# Patient Record
Sex: Female | Born: 1980 | Race: White | Hispanic: No | Marital: Married | Smoking: Never smoker
Health system: Southern US, Community
[De-identification: ages and names within clinical notes are randomized; demographics above are authoritative.]

## PROBLEM LIST (undated history)

## (undated) DIAGNOSIS — T4145XA Adverse effect of unspecified anesthetic, initial encounter: Secondary | ICD-10-CM

## (undated) DIAGNOSIS — Z803 Family history of malignant neoplasm of breast: Secondary | ICD-10-CM

## (undated) DIAGNOSIS — F419 Anxiety disorder, unspecified: Secondary | ICD-10-CM

## (undated) DIAGNOSIS — Z8619 Personal history of other infectious and parasitic diseases: Secondary | ICD-10-CM

## (undated) DIAGNOSIS — J45909 Unspecified asthma, uncomplicated: Secondary | ICD-10-CM

## (undated) DIAGNOSIS — T8859XA Other complications of anesthesia, initial encounter: Secondary | ICD-10-CM

## (undated) DIAGNOSIS — N12 Tubulo-interstitial nephritis, not specified as acute or chronic: Secondary | ICD-10-CM

## (undated) HISTORY — PX: NO PAST SURGERIES: SHX2092

## (undated) HISTORY — DX: Family history of malignant neoplasm of breast: Z80.3

## (undated) HISTORY — DX: Unspecified asthma, uncomplicated: J45.909

## (undated) HISTORY — DX: Personal history of other infectious and parasitic diseases: Z86.19

---
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
## 2002-07-31 ENCOUNTER — Other Ambulatory Visit: Admission: RE | Admit: 2002-07-31 | Discharge: 2002-07-31 | Payer: Self-pay | Admitting: Obstetrics and Gynecology

## 2003-09-05 ENCOUNTER — Other Ambulatory Visit: Admission: RE | Admit: 2003-09-05 | Discharge: 2003-09-05 | Payer: Self-pay | Admitting: Obstetrics and Gynecology

## 2004-09-29 ENCOUNTER — Other Ambulatory Visit: Admission: RE | Admit: 2004-09-29 | Discharge: 2004-09-29 | Payer: Self-pay | Admitting: Obstetrics and Gynecology

## 2006-03-23 HISTORY — PX: EYE SURGERY: SHX253

## 2008-01-26 ENCOUNTER — Inpatient Hospital Stay (HOSPITAL_COMMUNITY): Admission: AD | Admit: 2008-01-26 | Discharge: 2008-01-26 | Payer: Self-pay | Admitting: Obstetrics and Gynecology

## 2008-01-27 ENCOUNTER — Inpatient Hospital Stay (HOSPITAL_COMMUNITY): Admission: AD | Admit: 2008-01-27 | Discharge: 2008-01-29 | Payer: Self-pay | Admitting: Obstetrics and Gynecology

## 2009-03-23 DIAGNOSIS — N12 Tubulo-interstitial nephritis, not specified as acute or chronic: Secondary | ICD-10-CM

## 2009-03-23 HISTORY — DX: Tubulo-interstitial nephritis, not specified as acute or chronic: N12

## 2010-11-03 ENCOUNTER — Inpatient Hospital Stay (HOSPITAL_COMMUNITY)
Admission: AD | Admit: 2010-11-03 | Discharge: 2010-11-03 | Disposition: A | Discharge status: Home / Self Care | Payer: Self-pay | Source: Ambulatory Visit | Attending: Obstetrics & Gynecology | Admitting: Obstetrics & Gynecology

## 2010-11-03 ENCOUNTER — Encounter (HOSPITAL_COMMUNITY): Payer: Self-pay

## 2010-11-03 DIAGNOSIS — O99891 Other specified diseases and conditions complicating pregnancy: Secondary | ICD-10-CM | POA: Insufficient documentation

## 2010-11-03 DIAGNOSIS — R109 Unspecified abdominal pain: Secondary | ICD-10-CM | POA: Insufficient documentation

## 2010-11-03 DIAGNOSIS — M549 Dorsalgia, unspecified: Secondary | ICD-10-CM

## 2010-11-03 DIAGNOSIS — O26899 Other specified pregnancy related conditions, unspecified trimester: Secondary | ICD-10-CM

## 2010-11-03 HISTORY — DX: Adverse effect of unspecified anesthetic, initial encounter: T41.45XA

## 2010-11-03 HISTORY — DX: Tubulo-interstitial nephritis, not specified as acute or chronic: N12

## 2010-11-03 HISTORY — DX: Other complications of anesthesia, initial encounter: T88.59XA

## 2010-11-03 LAB — WET PREP, GENITAL
Clue Cells Wet Prep HPF POC: NONE SEEN
Yeast Wet Prep HPF POC: NONE SEEN

## 2010-11-03 LAB — URINE MICROSCOPIC-ADD ON

## 2010-11-03 LAB — URINALYSIS, ROUTINE W REFLEX MICROSCOPIC
Glucose, UA: NEGATIVE mg/dL
Ketones, ur: NEGATIVE mg/dL
Protein, ur: NEGATIVE mg/dL
pH: 8 (ref 5.0–8.0)

## 2010-11-03 MED ORDER — ACETAMINOPHEN 500 MG PO TABS
1000.0000 mg | ORAL_TABLET | Freq: Once | ORAL | Status: AC
  Administered 2010-11-03: 1000 mg via ORAL
  Filled 2010-11-03: qty 2

## 2010-11-03 NOTE — ED Provider Notes (Signed)
History     Chief Complaint  Patient presents with  . Abdominal Cramping   HPI  OB History    Grav Para Term Preterm Abortions TAB SAB Ect Mult Living   2 1 1  0 0 0 0 0 0 1      Past Medical History  Diagnosis Date  . Complication of anesthesia     one side take of epidural  . Pyelonephritis 2011    not during pregnancy    Past Surgical History  Procedure Date  . No past surgeries     No family history on file.  History  Substance Use Topics  . Smoking status: Never Smoker   . Smokeless tobacco: Not on file  . Alcohol Use: No    Allergies: No Known Allergies  Prescriptions prior to admission  Medication Sig Dispense Refill  . Calcium Carbonate Antacid (TUMS ULTRA 1000 PO) Take 2 tablets by mouth 3 (three) times daily as needed. As needed for heartburn       . loratadine (CLARITIN) 10 MG tablet Take 10 mg by mouth daily as needed. As needed for allergies       . prenatal vitamin w/FE, FA (PRENATAL 1 + 1) 27-1 MG TABS Take 1 tablet by mouth daily.          ROS Physical Exam   Blood pressure 111/68, pulse 75, temperature 98 F (36.7 C), temperature source Oral, resp. rate 18, height 5\' 8"  (1.727 m), weight 68.584 kg (151 lb 3.2 oz), SpO2 100.00%.  Physical Exam  MAU Course  Procedures  MDM Preg at 27 weeks no contractions. Common discomfort of pregnancy.  Assessment and Plan  D/c home  Zerita Boers 11/03/2010, 8:50 PM In with c/o back

## 2010-11-03 NOTE — ED Provider Notes (Signed)
Chief Complaint:  Abdominal Cramping   Miranda Davis is  30 y.o. G2P1001 at [redacted]w[redacted]d presents complaining of Abdominal Cramping . Pt states having abdominal pain with awakening and some diarrhea.  She has had cramping in back and abdomen without vaginal bleeding, no rupture of membranes, +FM good, ?contractions every 30 minutes irregular. She denies any fever, chills, +nausea without emesis.  Denies any sick contacts directly.  Only had 2 episodes of diarrhea, without hematochezia.  Denies taking any medication for her symptoms.  Currently tolerating po.   She denies any urinary symptoms.  Receives prenatal care in [place].  Came to get checked out.  Obstetrical/Gynecological History: OB History    Grav Para Term Preterm Abortions TAB SAB Ect Mult Living   2 1 1  0 0 0 0 0 0 1     Denies any STIs or HSV Past Medical History: Past Medical History  Diagnosis Date  . Complication of anesthesia     one side take of epidural  . Pyelonephritis 2011    not during pregnancy    Past Surgical History: Past Surgical History  Procedure Date  . No past surgeries     Family History: No family history on file.  Social History: History  Substance Use Topics  . Smoking status: Never Smoker   . Smokeless tobacco: Not on file  . Alcohol Use: No    Allergies: No Known Allergies  Prescriptions prior to admission  Medication Sig Dispense Refill  . Calcium Carbonate Antacid (TUMS ULTRA 1000 PO) Take 2 tablets by mouth 3 (three) times daily as needed. As needed for heartburn       . loratadine (CLARITIN) 10 MG tablet Take 10 mg by mouth daily as needed. As needed for allergies       . prenatal vitamin w/FE, FA (PRENATAL 1 + 1) 27-1 MG TABS Take 1 tablet by mouth daily.          Review of Systems - Negative except per HPI  Physical Exam   Blood pressure 111/68, pulse 75, temperature 98 F (36.7 C), temperature source Oral, resp. rate 18, height 5\' 8"  (1.727 m), weight 68.584 kg (151  lb 3.2 oz), SpO2 100.00%.  General: General appearance - alert, well appearing, and in no distress Chest - clear to auscultation, no wheezes, rales or rhonchi, symmetric air entry Heart - normal rate, regular rhythm, normal S1, S2, no murmurs, rubs, clicks or gallops Abdomen - soft, nontender, nondistended, no masses or organomegaly Gravid, size cwd, no CVAT bilaterally, mild low back TTP Pelvic - normal external genitalia, vulva, vagina, cervix, uterus and adnexa, CERVIX: normal appearing cervix without discharge or lesions, visually closed Extremities - peripheral pulses normal, no pedal edema, no clubbing or cyanosis Baseline: 140 bpm, Variability: Good {> 6 bpm), Accelerations: Reactive and Decelerations: Absent none   Labs: No results found for this or any previous visit (from the past 24 hour(s)). Imaging Studies:  No results found.   Assessment: Miranda Davis is  30 y.o. G2P1001 at [redacted]w[redacted]d presents with possible viral colitis.  Plan: 1. Tylenol prn 2. Wet prep and cultures sent. Awaiting results.  Anticipate d/c home  Lashaunda Schild,LACHELLE8/13/20126:25 PM

## 2010-11-03 NOTE — Progress Notes (Signed)
Up to bathroom

## 2010-11-03 NOTE — Progress Notes (Signed)
Pt states she gets her prenatal care with Dr. Alyce Pagan in [facility] Has been having abdominal and back cramping. Has had some nausea, no vomiting but has had diarrhea x 2 today. No bleeding or leaking and good fetal movement.

## 2010-11-03 NOTE — Progress Notes (Signed)
Diarrhea with unknown cause, no particular food suspected, exposed on 7/28 to adults who had been around children with 5th disease, no relatives ill with diarhea

## 2010-12-23 LAB — CBC
HCT: 29.1 — ABNORMAL LOW
HCT: 33.2 — ABNORMAL LOW
Hemoglobin: 10.3 — ABNORMAL LOW
MCHC: 34.7
MCHC: 35.2
MCV: 100.4 — ABNORMAL HIGH
Platelets: 188
Platelets: 201
RBC: 3.3 — ABNORMAL LOW
RDW: 13.2
WBC: 10.7 — ABNORMAL HIGH

## 2010-12-23 LAB — RH IMMUNE GLOB WKUP(>/=20WKS)(NOT WOMEN'S HOSP)

## 2010-12-23 LAB — RPR: RPR Ser Ql: NONREACTIVE

## 2011-05-07 ENCOUNTER — Encounter (HOSPITAL_COMMUNITY): Payer: Self-pay | Admitting: *Deleted

## 2011-06-09 ENCOUNTER — Ambulatory Visit (INDEPENDENT_AMBULATORY_CARE_PROVIDER_SITE_OTHER): Payer: BC Managed Care – PPO | Admitting: Family Medicine

## 2011-06-09 ENCOUNTER — Ambulatory Visit: Payer: BC Managed Care – PPO

## 2011-06-09 VITALS — BP 114/77 | HR 63 | Temp 97.8°F | Resp 16 | Ht 68.0 in | Wt 130.8 lb

## 2011-06-09 DIAGNOSIS — N939 Abnormal uterine and vaginal bleeding, unspecified: Secondary | ICD-10-CM

## 2011-06-09 DIAGNOSIS — R109 Unspecified abdominal pain: Secondary | ICD-10-CM

## 2011-06-09 DIAGNOSIS — R1011 Right upper quadrant pain: Secondary | ICD-10-CM

## 2011-06-09 DIAGNOSIS — IMO0001 Reserved for inherently not codable concepts without codable children: Secondary | ICD-10-CM

## 2011-06-09 LAB — POCT URINALYSIS DIPSTICK
Bilirubin, UA: NEGATIVE
Glucose, UA: NEGATIVE
Ketones, UA: NEGATIVE
Leukocytes, UA: NEGATIVE
Nitrite, UA: NEGATIVE
Protein, UA: NEGATIVE
Spec Grav, UA: 1.02
Urobilinogen, UA: 0.2
pH, UA: 7.5

## 2011-06-09 LAB — POCT CBC
Granulocyte percent: 54.2 % (ref 37–80)
HCT, POC: 40.8 % (ref 37.7–47.9)
Hemoglobin: 13.6 g/dL (ref 12.2–16.2)
Lymph, poc: 2.8 (ref 0.6–3.4)
MCH, POC: 30.7 pg (ref 27–31.2)
MCHC: 33.3 g/dL (ref 31.8–35.4)
MCV: 92.1 fL (ref 80–97)
MID (cbc): 0.6 (ref 0–0.9)
MPV: 9.8 fL (ref 0–99.8)
POC Granulocyte: 4 (ref 2–6.9)
POC LYMPH PERCENT: 37.8 %L (ref 10–50)
POC MID %: 8 %M (ref 0–12)
Platelet Count, POC: 286 10*3/uL (ref 142–424)
RBC: 4.43 M/uL (ref 4.04–5.48)
RDW, POC: 11.9 %
WBC: 7.3 10*3/uL (ref 4.6–10.2)

## 2011-06-09 LAB — POCT URINE PREGNANCY: Preg Test, Ur: NEGATIVE

## 2011-06-09 LAB — POCT UA - MICROSCOPIC ONLY
Bacteria, U Microscopic: NEGATIVE
Casts, Ur, LPF, POC: NEGATIVE
Crystals, Ur, HPF, POC: NEGATIVE
Epithelial cells, urine per micros: NEGATIVE
Mucus, UA: NEGATIVE
RBC, urine, microscopic: NEGATIVE
WBC, Ur, HPF, POC: NEGATIVE
Yeast, UA: NEGATIVE

## 2011-06-09 MED ORDER — KETOROLAC TROMETHAMINE 30 MG/ML IJ SOLN
30.0000 mg | Freq: Once | INTRAMUSCULAR | Status: AC
  Administered 2011-06-09: 30 mg via INTRAMUSCULAR

## 2011-06-09 MED ORDER — NORGESTIM-ETH ESTRAD TRIPHASIC 0.18/0.215/0.25 MG-35 MCG PO TABS: 1.0000 | ORAL_TABLET | Freq: Every day | ORAL | Status: AC

## 2011-06-09 NOTE — Progress Notes (Signed)
[facility]:  Office Visit  Chief Complaint:  Chief Complaint  Patient presents with  . Abdominal Pain    rlq  . Vaginal Bleeding    irregular    HPI: Miranda Davis is a 31 y.o. female who complains of : 1. RUQ abd pain, sharp pain, constant, increases with movement and ROM, x 2-3 weeks. NO trauma/injury. Tried Advil with some relief. NO UTI sxs--last Sunday had nausea, fevers, chills, and was dx with flu and was given Tamiflu.  2. 4 months out from giving birth , G2P2L2. Started bleeding Feb 18. She is on birth control pill, and is on only progesterone. Not heavy cycles. Spotting mostly, She wears 2 light pads a day. + dizziness.    Past Medical History  Diagnosis Date  . Complication of anesthesia     one side take of epidural  . Pyelonephritis 2011    not during pregnancy   Past Surgical History  Procedure Date  . No past surgeries    History   Social History  . Marital Status: Married    Spouse Name: N/A    Number of Children: N/A  . Years of Education: N/A   Social History Main Topics  . Smoking status: Never Smoker   . Smokeless tobacco: None  . Alcohol Use: No  . Drug Use: No  . Sexually Active: Not Currently   Other Topics Concern  . None   Social History Narrative  . None   No family history on file. No Known Allergies Prior to Admission medications   Medication Sig Start Date End Date Taking? Authorizing Provider  Calcium Carbonate Antacid (TUMS ULTRA 1000 PO) Take 2 tablets by mouth 3 (three) times daily as needed. As needed for heartburn     Historical Provider, MD  loratadine (CLARITIN) 10 MG tablet Take 10 mg by mouth daily as needed. As needed for allergies     Historical Provider, MD  prenatal vitamin w/FE, FA (PRENATAL 1 + 1) 27-1 MG TABS Take 1 tablet by mouth daily.      Historical Provider, MD     ROS: The patient denies fevers, chills, night sweats, unintentional weight loss, chest pain, palpitations, wheezing,  dyspnea on exertion, nausea, vomiting, abdominal pain, dysuria, hematuria, melena, numbness, weakness, or tingling. + vaginal bleeding prolonged, + dizziness  All other systems have been reviewed and were otherwise negative with the exception of those mentioned in the HPI and as above.    PHYSICAL EXAM: Filed Vitals:   06/09/11 1328  BP: 114/77  Pulse: 63  Temp: 97.8 F (36.6 C)  Resp: 16   Filed Vitals:   06/09/11 1328  Height: 5\' 8"  (1.727 m)  Weight: 130 lb 12.8 oz (59.33 kg)   Body mass index is 19.89 kg/(m^2).  General: Alert, no acute distress HEENT:  Normocephalic, atraumatic, oropharynx patent.  Cardiovascular:  Regular rate and rhythm, no rubs murmurs or gallops.  No Carotid bruits, radial pulse intact. No pedal edema.  Respiratory: Clear to auscultation bilaterally.  No wheezes, rales, or rhonchi.  No cyanosis, no use of accessory musculature GI: No organomegaly, abdomen is soft, + RUQ tender, positive bowel sounds.  No masses. NO gurading or rebound Skin: No rashes. Neurologic: Facial musculature symmetric. Psychiatric: Patient is appropriate throughout our interaction. Lymphatic: No cervical lymphadenopathy Musculoskeletal: Gait intact. Cervix-normal, minimal blood.cervix closed. No tenderness, purulent draiange, masses/lesions  + Right CVA tenderness   LABS: Results for orders placed in visit on 06/09/11  POCT CBC      Component Value Range   WBC 7.3  4.6 - 10.2 (K/uL)   Lymph, poc 2.8  0.6 - 3.4    POC LYMPH PERCENT 37.8  10 - 50 (%L)   MID (cbc) 0.6  0 - 0.9    POC MID % 8.0  0 - 12 (%M)   POC Granulocyte 4.0  2 - 6.9    Granulocyte percent 54.2  37 - 80 (%G)   RBC 4.43  4.04 - 5.48 (M/uL)   Hemoglobin 13.6  12.2 - 16.2 (g/dL)   HCT, POC 16.1  09.6 - 47.9 (%)   MCV 92.1  80 - 97 (fL)   MCH, POC 30.7  27 - 31.2 (pg)   MCHC 33.3  31.8 - 35.4 (g/dL)   RDW, POC 04.5     Platelet Count, POC 286  142 - 424 (K/uL)   MPV 9.8  0 - 99.8 (fL)  POCT UA -  MICROSCOPIC ONLY      Component Value Range   WBC, Ur, HPF, POC neg     RBC, urine, microscopic neg     Bacteria, U Microscopic neg     Mucus, UA neg     Epithelial cells, urine per micros neg     Crystals, Ur, HPF, POC neg     Casts, Ur, LPF, POC neg     Yeast, UA neg     Amorphous mod    POCT URINALYSIS DIPSTICK      Component Value Range   Color, UA yellow     Clarity, UA clear     Glucose, UA neg     Bilirubin, UA neg     Ketones, UA neg     Spec Grav, UA 1.020     Blood, UA small     pH, UA 7.5     Protein, UA neg     Urobilinogen, UA 0.2     Nitrite, UA neg     Leukocytes, UA Negative    POCT URINE PREGNANCY      Component Value Range   Preg Test, Ur Negative       EKG/XRAY:   Primary read interpreted by Dr. Conley Rolls at [facility]. Xray negative except ive for ?  Opacity in upper right quadrant not related to ribs. ? Renal stone???  ASSESSMENT/PLAN: Encounter Diagnoses  Name Primary?  . Abdominal pain, acute, right upper quadrant Yes  . Flank pain   . Vaginal bleeding    ? Renal stone, defer CT scan for now. Will give toradol. Push fluids. Urine strain. Unlikely ectopic , pregnancy test  negative Change OCP to something that will decrease breakthrough bleeding with progesterone only pill, will go back to orthotricyclen.  Patient advised to return sooner or go to ER  if sxs persists with fever, chills, worsening RUQ pain.     Hamilton Capri PHUONG, DO 06/09/2011 2:59 PM

## 2011-06-10 LAB — COMPREHENSIVE METABOLIC PANEL
ALT: 19 U/L (ref 0–35)
AST: 21 U/L (ref 0–37)
Alkaline Phosphatase: 55 U/L (ref 39–117)
BUN: 11 mg/dL (ref 6–23)
Calcium: 9.2 mg/dL (ref 8.4–10.5)
Creat: 0.71 mg/dL (ref 0.50–1.10)
Total Bilirubin: 0.6 mg/dL (ref 0.3–1.2)

## 2011-06-10 LAB — COMPREHENSIVE METABOLIC PANEL WITH GFR
Albumin: 4.2 g/dL (ref 3.5–5.2)
CO2: 25 meq/L (ref 19–32)
Chloride: 107 meq/L (ref 96–112)
Glucose, Bld: 86 mg/dL (ref 70–99)
Potassium: 4.3 meq/L (ref 3.5–5.3)
Sodium: 142 meq/L (ref 135–145)
Total Protein: 6.8 g/dL (ref 6.0–8.3)

## 2011-06-11 ENCOUNTER — Telehealth: Payer: Self-pay | Admitting: Family Medicine

## 2011-06-11 NOTE — Telephone Encounter (Signed)
Spoke with patietn about labs and xray results. She is feeling better, not completely 100%. Xrays showed 2 ? Renal stones on left side but her pain is on the right side. Told her to c/w fluids and Advil and see how that goes. IF she cont to have pain consider CT abd. But ok for now.We also switched OCP her bleeding has decreased.

## 2012-10-10 IMAGING — CR DG ABDOMEN ACUTE W/ 1V CHEST
3 series · 3 of 3 positions shown · non-contrast
Comparison: No priors.

CLINICAL DATA: Right-sided abdominal pain for past two - 3 weeks.

ACUTE ABDOMEN SERIES (ABDOMEN 2 VIEW & CHEST 1 VIEW)

[PA]
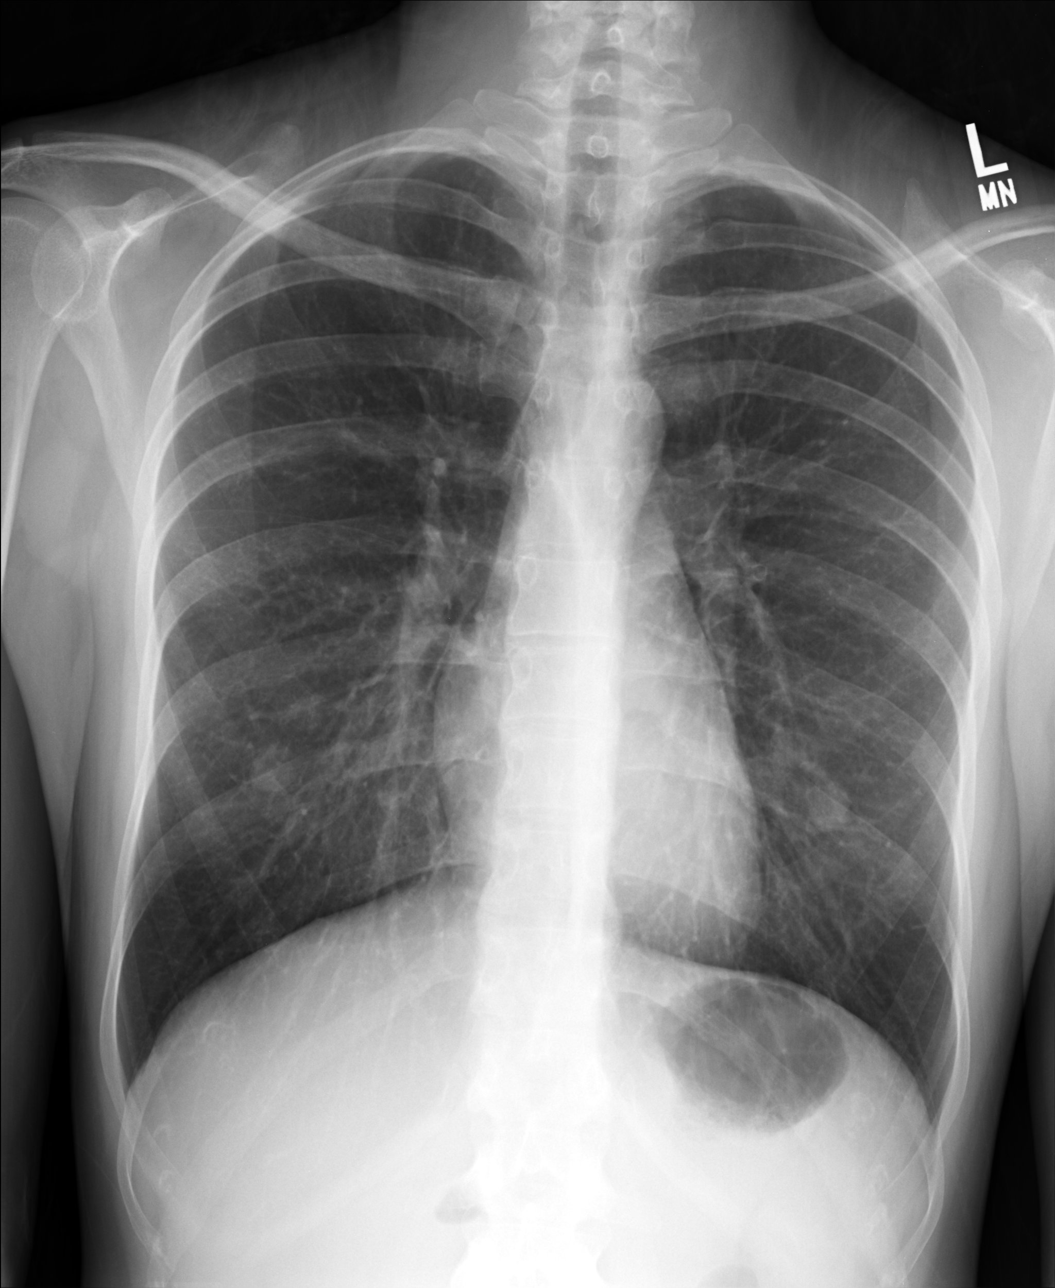

[AP (1 of 2)]
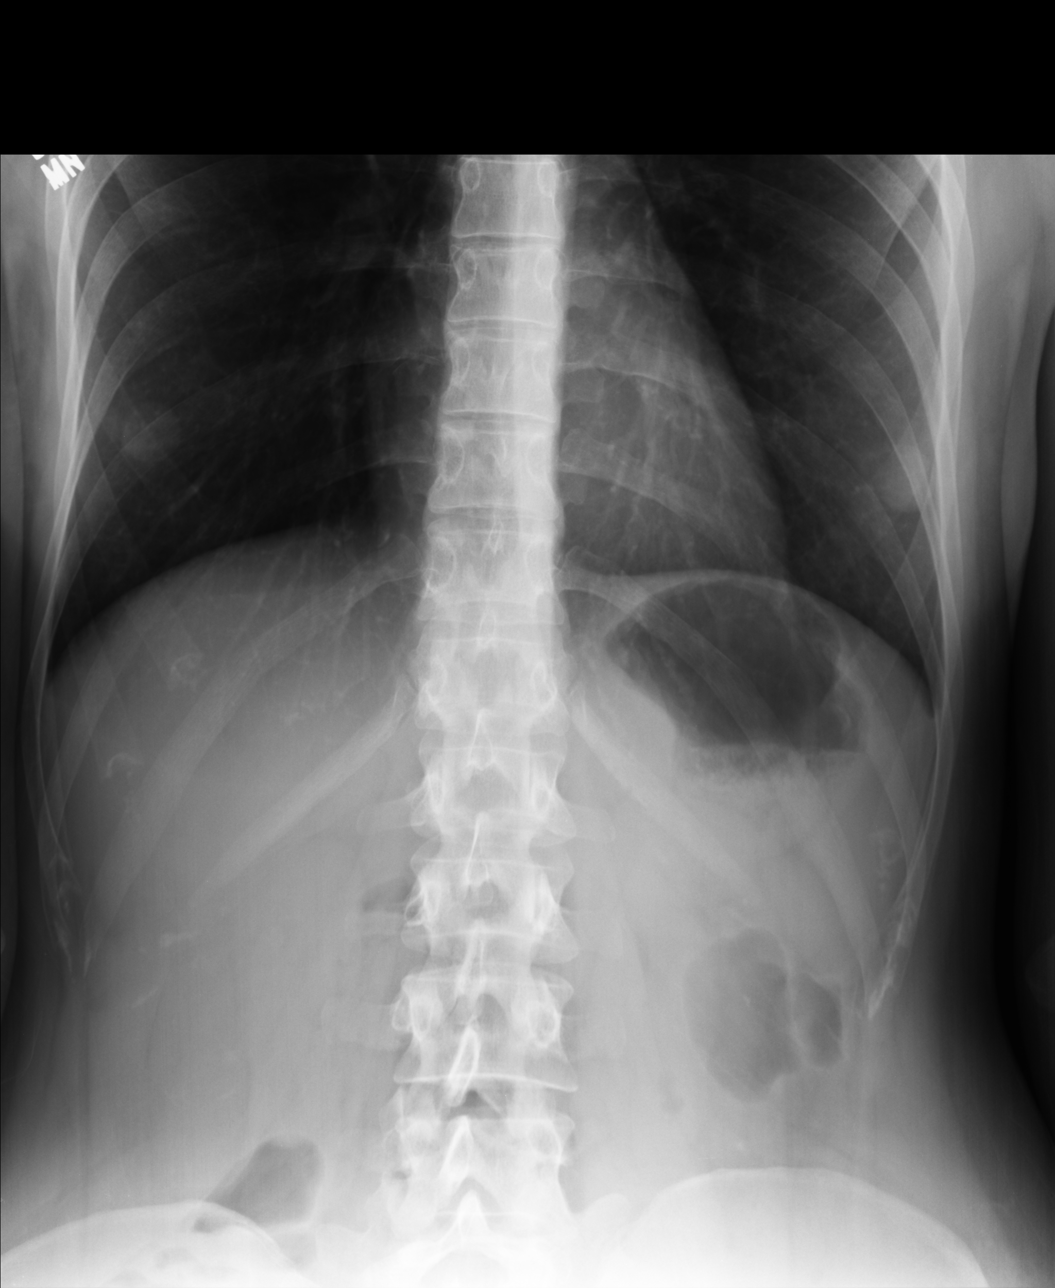

[AP (2 of 2)]
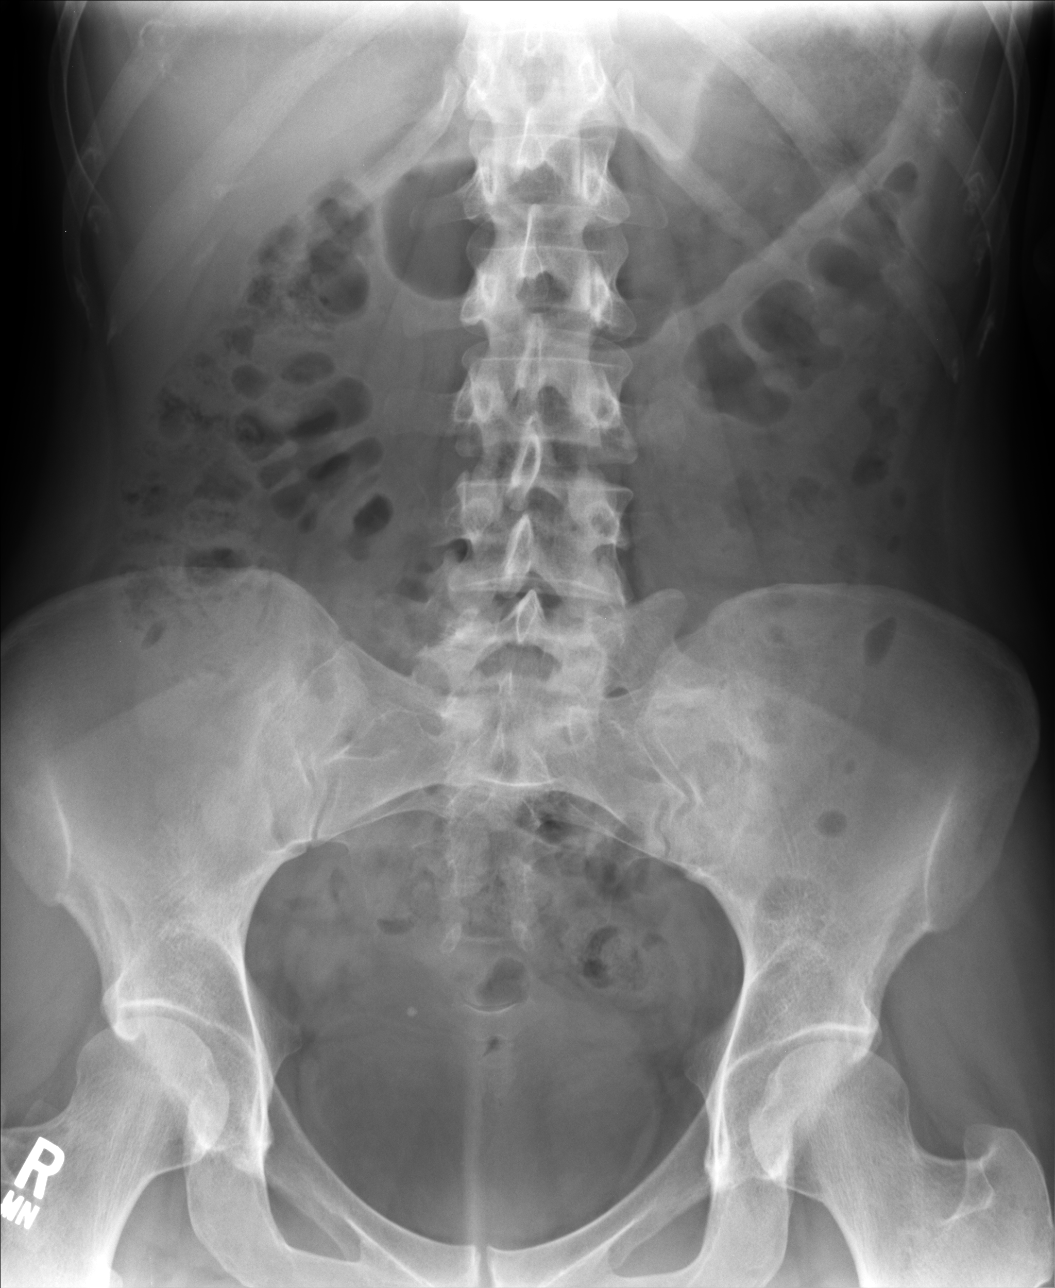

[3 of 3 positions shown; findings below may reference images not displayed]

FINDINGS: Lung volumes are normal.  No consolidative airspace
disease.  No pleural effusions.  No pneumothorax.   Pulmonary
vasculature and the cardiomediastinal silhouette are within normal
limits.  Well defined nodular densities that are symmetric in
appearance project over the lower lungs bilaterally, consistent
with prominent nipple shadows. No other suspicious appearing
pulmonary nodule or mass noted.

Supine and upright views of the abdomen demonstrate gas and stool
spread throughout the colon extending to the rectum.  No pathologic
distension of small bowel.  No gross evidence of pneumoperitoneum.
Two faint calcifications are seen projecting over the left upper
quadrant of the abdomen.
IMPRESSION: 1.  No radiographic evidence of acute cardiopulmonary disease.
2.  Nonobstructive bowel gas pattern.  No pneumoperitoneum.
3.  Two tiny and calcifications projecting over the left upper
quadrant of the abdomen could potentially represent renal calculi.
If there is strong clinical concern for left renal calculi, further
evaluation with noncontrast CT of the abdomen and pelvis would be
recommended.

## 2014-01-25 LAB — OB RESULTS CONSOLE GC/CHLAMYDIA
Chlamydia: NEGATIVE
GC PROBE AMP, GENITAL: NEGATIVE

## 2014-01-25 LAB — OB RESULTS CONSOLE HEPATITIS B SURFACE ANTIGEN: HEP B S AG: NEGATIVE

## 2014-01-25 LAB — OB RESULTS CONSOLE ANTIBODY SCREEN: Antibody Screen: NEGATIVE

## 2014-01-25 LAB — OB RESULTS CONSOLE ABO/RH: RH TYPE: NEGATIVE

## 2014-01-25 LAB — OB RESULTS CONSOLE RUBELLA ANTIBODY, IGM: Rubella: IMMUNE

## 2014-01-25 LAB — OB RESULTS CONSOLE RPR: RPR: NONREACTIVE

## 2014-01-25 LAB — OB RESULTS CONSOLE HIV ANTIBODY (ROUTINE TESTING): HIV: NONREACTIVE

## 2014-03-23 NOTE — L&D Delivery Note (Signed)
Operative Delivery Note Patient pushed for 10 minutes after she was noted to be C/C/+2.  There was a prolonged episode of bradycardia nadir 70s and patient consented verbally for a low vacuum. Verbal consent: obtained from patient.  Risks and benefits discussed in detail.  Risks include, but are not limited to the risks of anesthesia, bleeding, infection, damage to maternal tissues, fetal cephalhematoma.  There is also the risk of inability to effect vaginal delivery of the head, or shoulder dystocia that cannot be resolved by established maneuvers, leading to the need for emergency cesarean section.  The bladder was empty because the foley catheter just had been removed.  Anesthesia was adequate.  Position verified to be ROA. Kiwi vacuum placed and with one pull and maternal effort at 10:01 AM a viable and healthy female was delivered via Vaginal, Kiwi Vacuum Investment banker, operational(Extractor).  Presentation: vertex; Position: Right,, Occiput,, Anterior; Station: +3.  The shoulders and body were easily delivered.  The cord was double clamped and cut and handed to the waiting nurses at the warmer.   The umbilical cord avulsed during placental delivery so it had to be manually removed.  The uterus was swept manually ensuring no remaining membranes.  The placenta was noted to be intact.  Uterine atony was alleviated with massage and IV pitocin.    APGAR: 6, 9; weight 8 lb 7.6 oz (3845 g).   Placenta status: Intact, Manual removal.   Cord: 3 vessels with the following complications: None.    Anesthesia: Epidural  Instruments: Kiwi vacuum Episiotomy: None Lacerations: None Suture Repair: n/a Est. Blood Loss (mL): 322  Mom to postpartum.  Baby to Couplet care / Skin to Skin.  Miranda Davis, Miranda Davis 08/24/2014, 12:02 PM

## 2014-07-20 LAB — OB RESULTS CONSOLE GBS: STREP GROUP B AG: NEGATIVE

## 2014-08-16 ENCOUNTER — Inpatient Hospital Stay (HOSPITAL_COMMUNITY)
Admission: AD | Admit: 2014-08-16 | Payer: BC Managed Care – PPO | Source: Ambulatory Visit | Admitting: Obstetrics and Gynecology

## 2014-08-21 ENCOUNTER — Encounter (HOSPITAL_COMMUNITY): Payer: Self-pay | Admitting: *Deleted

## 2014-08-21 ENCOUNTER — Telehealth (HOSPITAL_COMMUNITY): Payer: Self-pay | Admitting: *Deleted

## 2014-08-21 NOTE — Telephone Encounter (Signed)
Preadmission screen  

## 2014-08-23 ENCOUNTER — Inpatient Hospital Stay (HOSPITAL_COMMUNITY)
Admission: RE | Admit: 2014-08-23 | Discharge: 2014-08-26 | DRG: 767 | Disposition: A | Discharge status: Home / Self Care | Payer: BC Managed Care – PPO | Source: Ambulatory Visit | Attending: Obstetrics & Gynecology | Admitting: Obstetrics & Gynecology

## 2014-08-23 ENCOUNTER — Encounter (HOSPITAL_COMMUNITY): Payer: Self-pay

## 2014-08-23 VITALS — BP 110/87 | HR 67 | Temp 98.0°F | Resp 16 | Ht 68.0 in | Wt 169.0 lb

## 2014-08-23 DIAGNOSIS — Z3A41 41 weeks gestation of pregnancy: Secondary | ICD-10-CM | POA: Diagnosis present

## 2014-08-23 DIAGNOSIS — K219 Gastro-esophageal reflux disease without esophagitis: Secondary | ICD-10-CM | POA: Diagnosis present

## 2014-08-23 DIAGNOSIS — O480 Post-term pregnancy: Principal | ICD-10-CM | POA: Diagnosis present

## 2014-08-23 DIAGNOSIS — O9962 Diseases of the digestive system complicating childbirth: Secondary | ICD-10-CM | POA: Diagnosis present

## 2014-08-23 DIAGNOSIS — Z8759 Personal history of other complications of pregnancy, childbirth and the puerperium: Secondary | ICD-10-CM

## 2014-08-23 LAB — CBC
HCT: 33.4 % — ABNORMAL LOW (ref 36.0–46.0)
Hemoglobin: 12 g/dL (ref 12.0–15.0)
MCH: 34.5 pg — ABNORMAL HIGH (ref 26.0–34.0)
MCHC: 35.9 g/dL (ref 30.0–36.0)
MCV: 96 fL (ref 78.0–100.0)
Platelets: 169 10*3/uL (ref 150–400)
RBC: 3.48 MIL/uL — AB (ref 3.87–5.11)
RDW: 13.5 % (ref 11.5–15.5)
WBC: 10.1 10*3/uL (ref 4.0–10.5)

## 2014-08-23 MED ORDER — LACTATED RINGERS IV SOLN
INTRAVENOUS | Status: DC
  Administered 2014-08-23 – 2014-08-24 (×2): via INTRAVENOUS

## 2014-08-23 MED ORDER — TERBUTALINE SULFATE 1 MG/ML IJ SOLN: 0.2500 mg | Freq: Once | INTRAMUSCULAR | Status: AC | PRN

## 2014-08-23 MED ORDER — OXYTOCIN 40 UNITS IN LACTATED RINGERS INFUSION - SIMPLE MED
62.5000 mL/h | INTRAVENOUS | Status: DC
  Filled 2014-08-23: qty 1000

## 2014-08-23 MED ORDER — LIDOCAINE HCL (PF) 1 % IJ SOLN
30.0000 mL | INTRAMUSCULAR | Status: DC | PRN
  Filled 2014-08-23: qty 30

## 2014-08-23 MED ORDER — CITRIC ACID-SODIUM CITRATE 334-500 MG/5ML PO SOLN: 30.0000 mL | ORAL | Status: DC | PRN

## 2014-08-23 MED ORDER — OXYCODONE-ACETAMINOPHEN 5-325 MG PO TABS: 1.0000 | ORAL_TABLET | ORAL | Status: DC | PRN

## 2014-08-23 MED ORDER — ACETAMINOPHEN 325 MG PO TABS: 650.0000 mg | ORAL_TABLET | ORAL | Status: DC | PRN

## 2014-08-23 MED ORDER — MISOPROSTOL 25 MCG QUARTER TABLET
25.0000 ug | ORAL_TABLET | ORAL | Status: DC | PRN
  Administered 2014-08-23: 25 ug via VAGINAL
  Filled 2014-08-23: qty 1
  Filled 2014-08-23: qty 0.25

## 2014-08-23 MED ORDER — ONDANSETRON HCL 4 MG/2ML IJ SOLN: 4.0000 mg | Freq: Four times a day (QID) | INTRAMUSCULAR | Status: DC | PRN

## 2014-08-23 MED ORDER — LACTATED RINGERS IV SOLN
500.0000 mL | INTRAVENOUS | Status: DC | PRN
  Administered 2014-08-24: 500 mL via INTRAVENOUS

## 2014-08-23 MED ORDER — OXYTOCIN BOLUS FROM INFUSION
500.0000 mL | INTRAVENOUS | Status: DC
  Administered 2014-08-24: 500 mL via INTRAVENOUS

## 2014-08-23 MED ORDER — OXYCODONE-ACETAMINOPHEN 5-325 MG PO TABS: 2.0000 | ORAL_TABLET | ORAL | Status: DC | PRN

## 2014-08-24 ENCOUNTER — Inpatient Hospital Stay (HOSPITAL_COMMUNITY): Payer: BC Managed Care – PPO | Admitting: Anesthesiology

## 2014-08-24 ENCOUNTER — Encounter (HOSPITAL_COMMUNITY): Payer: Self-pay

## 2014-08-24 DIAGNOSIS — Z8759 Personal history of other complications of pregnancy, childbirth and the puerperium: Secondary | ICD-10-CM

## 2014-08-24 LAB — RPR: RPR Ser Ql: NONREACTIVE

## 2014-08-24 MED ORDER — DIPHENHYDRAMINE HCL 50 MG/ML IJ SOLN: 12.5000 mg | INTRAMUSCULAR | Status: DC | PRN

## 2014-08-24 MED ORDER — LIDOCAINE HCL (PF) 1 % IJ SOLN
INTRAMUSCULAR | Status: DC | PRN
  Administered 2014-08-24: 4 mL
  Administered 2014-08-24: 5 mL

## 2014-08-24 MED ORDER — ONDANSETRON HCL 4 MG/2ML IJ SOLN: 4.0000 mg | INTRAMUSCULAR | Status: DC | PRN

## 2014-08-24 MED ORDER — TETANUS-DIPHTH-ACELL PERTUSSIS 5-2.5-18.5 LF-MCG/0.5 IM SUSP: 0.5000 mL | Freq: Once | INTRAMUSCULAR | Status: DC

## 2014-08-24 MED ORDER — PRENATAL MULTIVITAMIN CH
1.0000 | ORAL_TABLET | Freq: Every day | ORAL | Status: DC
  Administered 2014-08-25: 1 via ORAL
  Filled 2014-08-24: qty 1

## 2014-08-24 MED ORDER — FENTANYL 2.5 MCG/ML BUPIVACAINE 1/10 % EPIDURAL INFUSION (WH - ANES): 14.0000 mL/h | INTRAMUSCULAR | Status: DC | PRN

## 2014-08-24 MED ORDER — FENTANYL 2.5 MCG/ML BUPIVACAINE 1/10 % EPIDURAL INFUSION (WH - ANES)
14.0000 mL/h | INTRAMUSCULAR | Status: DC | PRN
  Administered 2014-08-24: 14 mL/h via EPIDURAL
  Filled 2014-08-24: qty 125

## 2014-08-24 MED ORDER — WITCH HAZEL-GLYCERIN EX PADS: 1.0000 "application " | MEDICATED_PAD | CUTANEOUS | Status: DC | PRN

## 2014-08-24 MED ORDER — PHENYLEPHRINE 40 MCG/ML (10ML) SYRINGE FOR IV PUSH (FOR BLOOD PRESSURE SUPPORT): 80.0000 ug | PREFILLED_SYRINGE | INTRAVENOUS | Status: DC | PRN

## 2014-08-24 MED ORDER — SODIUM CHLORIDE 0.9 % IV SOLN
3.0000 g | Freq: Once | INTRAVENOUS | Status: AC
  Administered 2014-08-24: 3 g via INTRAVENOUS
  Filled 2014-08-24: qty 3

## 2014-08-24 MED ORDER — ACETAMINOPHEN 325 MG PO TABS: 650.0000 mg | ORAL_TABLET | ORAL | Status: DC | PRN

## 2014-08-24 MED ORDER — ZOLPIDEM TARTRATE 5 MG PO TABS: 5.0000 mg | ORAL_TABLET | Freq: Every evening | ORAL | Status: DC | PRN

## 2014-08-24 MED ORDER — PHENYLEPHRINE 40 MCG/ML (10ML) SYRINGE FOR IV PUSH (FOR BLOOD PRESSURE SUPPORT)
80.0000 ug | PREFILLED_SYRINGE | INTRAVENOUS | Status: DC | PRN
  Filled 2014-08-24: qty 2
  Filled 2014-08-24: qty 20

## 2014-08-24 MED ORDER — LANOLIN HYDROUS EX OINT: TOPICAL_OINTMENT | CUTANEOUS | Status: DC | PRN

## 2014-08-24 MED ORDER — OXYTOCIN 40 UNITS IN LACTATED RINGERS INFUSION - SIMPLE MED
1.0000 m[IU]/min | INTRAVENOUS | Status: DC
  Administered 2014-08-24: 2 m[IU]/min via INTRAVENOUS

## 2014-08-24 MED ORDER — EPHEDRINE 5 MG/ML INJ
10.0000 mg | INTRAVENOUS | Status: DC | PRN
  Filled 2014-08-24: qty 2

## 2014-08-24 MED ORDER — IBUPROFEN 600 MG PO TABS
600.0000 mg | ORAL_TABLET | Freq: Four times a day (QID) | ORAL | Status: DC
  Administered 2014-08-24 – 2014-08-26 (×7): 600 mg via ORAL
  Filled 2014-08-24 (×7): qty 1

## 2014-08-24 MED ORDER — BENZOCAINE-MENTHOL 20-0.5 % EX AERO
1.0000 "application " | INHALATION_SPRAY | CUTANEOUS | Status: DC | PRN
  Filled 2014-08-24: qty 56

## 2014-08-24 MED ORDER — OXYCODONE-ACETAMINOPHEN 5-325 MG PO TABS: 1.0000 | ORAL_TABLET | ORAL | Status: DC | PRN

## 2014-08-24 MED ORDER — TERBUTALINE SULFATE 1 MG/ML IJ SOLN
0.2500 mg | Freq: Once | INTRAMUSCULAR | Status: AC | PRN
  Filled 2014-08-24: qty 1

## 2014-08-24 MED ORDER — OXYCODONE-ACETAMINOPHEN 5-325 MG PO TABS: 2.0000 | ORAL_TABLET | ORAL | Status: DC | PRN

## 2014-08-24 MED ORDER — SENNOSIDES-DOCUSATE SODIUM 8.6-50 MG PO TABS
2.0000 | ORAL_TABLET | ORAL | Status: DC
  Administered 2014-08-25: 2 via ORAL
  Filled 2014-08-24 (×2): qty 2

## 2014-08-24 MED ORDER — ONDANSETRON HCL 4 MG PO TABS: 4.0000 mg | ORAL_TABLET | ORAL | Status: DC | PRN

## 2014-08-24 MED ORDER — SIMETHICONE 80 MG PO CHEW: 80.0000 mg | CHEWABLE_TABLET | ORAL | Status: DC | PRN

## 2014-08-24 MED ORDER — DIBUCAINE 1 % RE OINT: 1.0000 "application " | TOPICAL_OINTMENT | RECTAL | Status: DC | PRN

## 2014-08-24 MED ORDER — DIPHENHYDRAMINE HCL 25 MG PO CAPS: 25.0000 mg | ORAL_CAPSULE | Freq: Four times a day (QID) | ORAL | Status: DC | PRN

## 2014-08-24 NOTE — Anesthesia Preprocedure Evaluation (Signed)
Anesthesia Evaluation  Patient identified by MRN, date of birth, ID band Patient awake    Reviewed: Allergy & Precautions, Patient's Chart, lab work & pertinent test results  History of Anesthesia Complications (+) history of anesthetic complications  Airway Mallampati: II  TM Distance: >3 FB Neck ROM: Full    Dental no notable dental hx. (+) Teeth Intact   Pulmonary asthma ,  breath sounds clear to auscultation  Pulmonary exam normal       Cardiovascular negative cardio ROS Normal cardiovascular examRhythm:Regular Rate:Normal     Neuro/Psych negative neurological ROS  negative psych ROS   GI/Hepatic Neg liver ROS, GERD-  Medicated and Controlled,  Endo/Other  negative endocrine ROS  Renal/GU Renal diseaseHx/o Pyelonephritis  negative genitourinary   Musculoskeletal negative musculoskeletal ROS (+)   Abdominal   Peds  Hematology negative hematology ROS (+) anemia ,   Anesthesia Other Findings   Reproductive/Obstetrics (+) Pregnancy                             Anesthesia Physical Anesthesia Plan  ASA: II  Anesthesia Plan: Epidural   Post-op Pain Management:    Induction:   Airway Management Planned: Natural Airway  Additional Equipment:   Intra-op Plan:   Post-operative Plan:   Informed Consent: I have reviewed the patients History and Physical, chart, labs and discussed the procedure including the risks, benefits and alternatives for the proposed anesthesia with the patient or authorized representative who has indicated his/her understanding and acceptance.     Plan Discussed with: Anesthesiologist  Anesthesia Plan Comments: (One sided epidural last time)        Anesthesia Quick Evaluation

## 2014-08-24 NOTE — Anesthesia Procedure Notes (Signed)
Epidural Patient location during procedure: OB Start time: 08/24/2014 8:46 AM  Staffing Anesthesiologist: Mal AmabileFOSTER, Evie Croston  Preanesthetic Checklist Completed: patient identified, site marked, surgical consent, pre-op evaluation, timeout performed, IV checked, risks and benefits discussed and monitors and equipment checked  Epidural Patient position: sitting Prep: site prepped and draped and DuraPrep Patient monitoring: continuous pulse ox and blood pressure Approach: midline Location: L3-L4 Injection technique: LOR air  Needle:  Needle type: Tuohy  Needle gauge: 17 G Needle length: 9 cm and 9 Needle insertion depth: 4 cm Catheter type: closed end flexible Catheter size: 19 Gauge Catheter at skin depth: 9 cm Test dose: negative and Other  Assessment Events: blood not aspirated, injection not painful, no injection resistance, negative IV test and no paresthesia  Additional Notes Patient identified. Risks and benefits discussed including failed block, incomplete  Pain control, post dural puncture headache, nerve damage, paralysis, blood pressure Changes, nausea, vomiting, reactions to medications-both toxic and allergic and post Partum back pain. All questions were answered. Patient expressed understanding and wished to proceed. Sterile technique was used throughout procedure. Epidural site was Dressed with sterile barrier dressing. No paresthesias, signs of intravascular injection Or signs of intrathecal spread were encountered.  Patient was more comfortable after the epidural was dosed. Please see RN's note for documentation of vital signs and FHR which are stable.

## 2014-08-24 NOTE — Plan of Care (Signed)
Problem: Consults Goal: Birthing Suites Patient Information Press F2 to bring up selections list  Outcome: Completed/Met Date Met:  08/24/14  Pt > [redacted] weeks EGA and Inpatient induction

## 2014-08-24 NOTE — Lactation Note (Signed)
This note was copied from the chart of Miranda Kennon PortelaLauren Sardinha. Lactation Consultation Note  Patient Name: Miranda Davis [id number]'s Date: 08/24/2014 Reason for consult: Initial assessment Baby 8 hours of life. Mom reports that baby has nursed well several times. Mom reports that she nursed 2 older children for 3 months without any issues. Offered to assist with latching baby, but mom states baby not cueing to nurse and she has family that has just arrived and wanting to hold baby. Enc mom to call for assistance as needed. [facility], aware of [facility]/B[facility], community resources, and [facility] phone line assistance after D/C.  Maternal Data Has patient been taught Hand Expression?: Yes Does the patient have breastfeeding experience prior to this delivery?: Yes  Feeding Feeding Type: Breast Fed Length of feed: 20 min  LATCH Score/Interventions                      Lactation Tools Discussed/Used     Consult Status Consult Status: Follow-up Date: 08/25/14 Follow-up type: In-patient    Geralynn OchsWILLIARD, Rhylen Pulido 08/24/2014, 6:03 PM

## 2014-08-24 NOTE — H&P (Signed)
Pt is a 34 y/o white female, G3P2002 at 41+ wks who was admitted by Dr. Henderson CloudHorvath for an induction for a postterm preg. PNC was uncomplicated.  VSSAF PMHX: NIPT-wnl, see PNR PE: VSSAF         HEENT-wnl         ABD- gravid IMP/ IUP at 41+ weeks         Post term preg Plan/ Admit          Start cytotec.

## 2014-08-24 NOTE — Progress Notes (Signed)
Miranda MonarchLauren H Davis is a 34 y.o. G3P2002 at 3098w2d by LMP admitted for induction of labor due to Post dates.  Subjective: Patient starting to feel uncomfortable  Objective: BP 118/75 mmHg  Pulse 77  Temp(Src) 97.6 F (36.4 C) (Oral)  Resp 20  Ht 5\' 8"  (1.727 m)  Wt 76.658 kg (169 lb)  BMI 25.70 kg/m2  SpO2 100%  LMP 11/08/2013      FHT:  FHR: 120 bpm, variability: moderate,  accelerations:  Present,  decelerations:  Absent UC:   regular, every 2-3 minutes SVE:   Dilation: 4 Effacement (%): 60 Station: -2 Exam by:: dr Mora Applpinn  Labs: Lab Results  Component Value Date   WBC 10.1 08/23/2014   HGB 12.0 08/23/2014   HCT 33.4* 08/23/2014   MCV 96.0 08/23/2014   PLT 169 08/23/2014    Assessment / Plan: Induction of labor due to term with favorable cervix,  progressing well on pitocin  Labor: Progressing on Pitocin,  AROM performed, clear fluid Preeclampsia:  no signs or symptoms of toxicity Fetal Wellbeing:  Category I Pain Control:  Epidural I/D:  n/a Anticipated MOD:  NSVD  [name] STACIA 08/24/2014, 8:51 AM

## 2014-08-25 LAB — CBC
HCT: 30.8 % — ABNORMAL LOW (ref 36.0–46.0)
Hemoglobin: 10.9 g/dL — ABNORMAL LOW (ref 12.0–15.0)
MCH: 34.1 pg — ABNORMAL HIGH (ref 26.0–34.0)
MCHC: 35.4 g/dL (ref 30.0–36.0)
MCV: 96.3 fL (ref 78.0–100.0)
Platelets: 151 10*3/uL (ref 150–400)
RBC: 3.2 MIL/uL — ABNORMAL LOW (ref 3.87–5.11)
RDW: 13.5 % (ref 11.5–15.5)
WBC: 10.9 10*3/uL — AB (ref 4.0–10.5)

## 2014-08-25 MED ORDER — RHO D IMMUNE GLOBULIN 1500 UNIT/2ML IJ SOSY
300.0000 ug | PREFILLED_SYRINGE | Freq: Once | INTRAMUSCULAR | Status: AC
  Administered 2014-08-25: 300 ug via INTRAVENOUS
  Filled 2014-08-25: qty 2

## 2014-08-25 NOTE — Anesthesia Postprocedure Evaluation (Signed)
  Anesthesia Post-op Note  Patient: Miranda Davis  Procedure(s) Performed: * No procedures listed *  Patient Location: PACU  Anesthesia Type:Epidural  Level of Consciousness: awake, alert  and oriented  Airway and Oxygen Therapy: Patient Spontanous Breathing  Post-op Pain: none  Post-op Assessment: Post-op Vital signs reviewed and Patient's Cardiovascular Status Stable  Post-op Vital Signs: Reviewed and stable  Last Vitals:  Filed Vitals:   08/25/14 0607  BP: 104/64  Pulse: 72  Temp: 36.7 C  Resp: 20    Complications: No apparent anesthesia complications

## 2014-08-26 LAB — TYPE AND SCREEN
ABO/RH(D): O NEG
ANTIBODY SCREEN: POSITIVE
DAT, IgG: NEGATIVE
UNIT DIVISION: 0
Unit division: 0

## 2014-08-26 LAB — RH IG WORKUP (INCLUDES ABO/RH)
ABO/RH(D): O NEG
Fetal Screen: NEGATIVE
Gestational Age(Wks): 41.2
Unit division: 0

## 2014-08-26 MED ORDER — IBUPROFEN 600 MG PO TABS: 600.0000 mg | ORAL_TABLET | Freq: Four times a day (QID) | ORAL | Status: DC | PRN

## 2014-08-26 MED ORDER — OXYCODONE-ACETAMINOPHEN 5-325 MG PO TABS: 1.0000 | ORAL_TABLET | ORAL | Status: DC | PRN

## 2014-08-26 NOTE — Discharge Summary (Signed)
Obstetric Discharge Summary Reason for Admission: onset of labor Prenatal Procedures: NST Intrapartum Procedures: vacuum Postpartum Procedures: none Complications-Operative and Postpartum: none HEMOGLOBIN  Date Value Ref Range Status  08/25/2014 10.9* 12.0 - 15.0 g/dL Final  16/10/960403/19/2013 54.013.6 12.2 - 16.2 g/dL Final   HCT  Date Value Ref Range Status  08/25/2014 30.8* 36.0 - 46.0 % Final   HCT, POC  Date Value Ref Range Status  06/09/2011 40.8 37.7 - 47.9 % Final    Physical Exam:  General: alert, cooperative and no distress Lochia: appropriate Uterine Fundus: firm DVT Evaluation: No evidence of DVT seen on physical exam. Negative Homan's sign. No cords or calf tenderness Discharge Diagnoses: Term Pregnancy-delivered  Discharge Information: Date: 08/26/2014 Activity: pelvic rest Diet: routine Medications: PNV, Ibuprofen and Percocet Condition: stable Instructions: refer to practice specific booklet Discharge to: home   Newborn Data: Live born female  Birth Weight: 8 lb 7.6 oz (3845 g) APGAR: 6, 9  Home with mother.  Miranda HartINN, Miranda Davis Miranda Davis 08/26/2014, 8:59 AM

## 2014-08-26 NOTE — Progress Notes (Signed)
Post Partum Day 2 Subjective: no complaints, up ad lib, voiding, tolerating PO, + flatus and breast feeding w/o difficulty.  Objective: Blood pressure 110/87, pulse 67, temperature 98 F (36.7 C), temperature source Oral, resp. rate 16, height 5\' 8"  (1.727 m), weight 76.658 kg (169 lb), last menstrual period 11/08/2013, SpO2 99 %, unknown if currently breastfeeding.  Physical Exam:  General: alert, cooperative and no distress Lochia: appropriate Uterine Fundus: firm DVT Evaluation: No evidence of DVT seen on physical exam. Negative Homan's sign. No cords or calf tenderness.   Recent Labs  08/23/14 2010 08/25/14 0620  HGB 12.0 10.9*  HCT 33.4* 30.8*    Assessment/Plan: Discharge home, Breastfeeding and Contraception will discuss at post partum visit   LOS: 3 days   Tylerjames Hoglund STACIA 08/26/2014, 8:56 AM

## 2014-08-26 NOTE — Lactation Note (Signed)
This note was copied from the chart of Miranda Davis. Lactation Consultation Note  Baby recently bf for 20 min.  Mother denies questions or soreness. Discussed engorgement care and monitoring voids/stools.   Patient Name: Miranda Davis [id number]'s Date: 08/26/2014 Reason for consult: Follow-up assessment   Maternal Data    Feeding Feeding Type: Breast Fed Length of feed: 20 min  LATCH Score/Interventions                      Lactation Tools Discussed/Used     Consult Status Consult Status: Complete    Hardie PulleyBerkelhammer, Miniya Miguez Boschen 08/26/2014, 8:38 AM

## 2016-03-26 LAB — HM PAP SMEAR: HM PAP: NEGATIVE

## 2017-02-23 NOTE — Progress Notes (Deleted)
   Subjective:    Patient ID: Miranda Davis, female    DOB: 10-11-1980, 36 y.o.   MRN: [id number]  HPI:  Miranda Davis is here to establish as a new pt.  She is a pleasant 36 year old female.  PMH:     Review of Systems     Objective:   Physical Exam        Assessment & Plan:

## 2017-03-01 ENCOUNTER — Ambulatory Visit: Payer: BC Managed Care – PPO | Admitting: Adult Health

## 2017-03-08 NOTE — Progress Notes (Signed)
Subjective:    Patient ID: Miranda Davis, female    DOB: [birth year], 36 y.o.   MRN: [id number]  HPI:  Miranda Davis is here to establish as a new pt.  She is a very pleasant 36 year old female.  WUJ:[id number]:Eczema and exercise induced asthma, has "been years since I have needed an inhaler". She estimates to drink >70 oz water/day and follows a heart healthy diet. 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  She walks several days a week and "runs after the kids, constantly". .kd   Patient Care Team    Relationship Specialty Notifications Start End  William Hamburgeranford, Katy D, NP PCP - General Family Medicine  03/09/17   Waynard Reedsoss, Kendra, MD Consulting Physician Obstetrics and Gynecology  03/09/17     Patient Active Problem List   Diagnosis Date Noted  . Tinea capitis 03/09/2017  . Eczema 03/09/2017  . Healthcare maintenance 03/09/2017  . Postpartum care following vaginal delivery 08/24/2014  . Status post vacuum-assisted vaginal delivery 08/24/2014     Past Medical History:  Diagnosis Date  . Asthma   . Complication of anesthesia    one side take of epidural  . Hx of varicella   . Pyelonephritis 2011   not during pregnancy     Past Surgical History:  Procedure Laterality Date  . EYE SURGERY  2008  . NO PAST SURGERIES       Family History  Problem Relation Age of Onset  . Healthy Mother   . Healthy Father   . Healthy Sister   . Healthy Daughter   . Healthy Son   . COPD Paternal Grandmother        BREAST  . Healthy Son      Social History   Substance and Sexual Activity  Drug Use No     Social History   Substance and Sexual Activity  Alcohol Use Yes  . Alcohol/week: 1.8 oz  . Types: 3 Glasses of wine per week     Social History   Tobacco Use  Smoking Status Never Smoker  Smokeless Tobacco Never Used     Outpatient Encounter Medications as of 03/09/2017  Medication Sig  . hydrocortisone cream 0.5 % Apply 1  application topically daily as needed for itching (rash).  Marland Kitchen. ibuprofen (ADVIL,MOTRIN) 600 MG tablet Take 1 tablet (600 mg total) by mouth every 6 (six) hours as needed.  . norgestrel-ethinyl estradiol (LO/OVRAL,CRYSELLE) 0.3-30 MG-MCG tablet Take 1 tablet by mouth daily.  Marland Kitchen. desonide (DESOWEN) 0.05 % lotion Apply topically 2 (two) times daily.  Melene Muller. [START ON 03/11/2017] ketoconazole (NIZORAL) 2 % shampoo Apply 1 application topically 2 (two) times a week.  . [DISCONTINUED] calcium carbonate (TUMS - DOSED IN MG ELEMENTAL CALCIUM) 500 MG chewable tablet Chew 2 tablets by mouth daily as needed for indigestion or heartburn.  . [DISCONTINUED] diphenhydrAMINE (BENADRYL) 25 MG tablet Take 25 mg by mouth every 6 (six) hours as needed for itching (rash).  . [DISCONTINUED] oxyCODONE-acetaminophen (PERCOCET/ROXICET) 5-325 MG per tablet Take 1-2 tablets by mouth every 4 (four) hours as needed (for pain scale greater than or equal to 4 and less than 7).  . [DISCONTINUED] prenatal vitamin w/FE, FA (PRENATAL 1 + 1) 27-1 MG TABS Take 1 tablet by mouth daily.     No facility-administered encounter medications on file as of 03/09/2017.     Allergies: Patient has no known allergies.  Body mass index is 21.12  kg/m.  Blood pressure 107/75, pulse 85, height 5\' 8"  (1.727 m), weight 138 lb 14.4 oz (63 kg), last menstrual period 02/10/2017, not currently breastfeeding.    Review of Systems  Constitutional: Positive for fatigue. Negative for activity change, appetite change, chills, diaphoresis, fever and unexpected weight change.  HENT: Negative for congestion.   Eyes: Negative for visual disturbance.  Respiratory: Negative for cough, chest tightness, shortness of breath, wheezing and stridor.   Cardiovascular: Negative for chest pain, palpitations and leg swelling.  Gastrointestinal: Negative for abdominal distention, abdominal pain, blood in stool, constipation, diarrhea, nausea and vomiting.  Endocrine:  Negative for cold intolerance, heat intolerance, polydipsia, polyphagia and polyuria.  Genitourinary: Negative for difficulty urinating, flank pain and hematuria.  Musculoskeletal: Negative for arthralgias, back pain, gait problem, joint swelling, myalgias, neck pain and neck stiffness.  Skin: Positive for color change and rash. Negative for pallor and wound.  Neurological: Negative for dizziness and headaches.  Psychiatric/Behavioral: Negative for dysphoric mood, self-injury, sleep disturbance and suicidal ideas. The patient is not nervous/anxious and is not hyperactive.        Objective:   Physical Exam  Constitutional: She is oriented to person, place, and time. She appears well-developed and well-nourished. No distress.  HENT:  Head: Normocephalic and atraumatic.  Right Ear: External ear normal.  Left Ear: External ear normal.  Eyes: Conjunctivae are normal. Pupils are equal, round, and reactive to light.  Cardiovascular: Normal rate, regular rhythm, normal heart sounds and intact distal pulses.  No murmur heard. Pulmonary/Chest: Effort normal and breath sounds normal. No respiratory distress. She has no wheezes. She has no rales. She exhibits no tenderness.  Neurological: She is alert and oriented to person, place, and time.  Skin: Skin is warm and dry. Rash noted. She is not diaphoretic. No erythema. No pallor.  Tinea capitis at base of hair line Area of active eczema around R ankle/lateral shin   Psychiatric: She has a normal mood and affect. Her behavior is normal. Judgment and thought content normal.          Assessment & Plan:   1. Healthcare maintenance   2. Eczema, unspecified type   3. Tinea capitis     Healthcare maintenance Continue your excellent water intake and healthy eating. Please return in the next few weeks for labs and full physical in 6 months.  Tinea capitis Ketoconazole 2% shampoo as directed  Eczema Desonide 0.05% lotion as  directed    FOLLOW-UP:  Return in about 6 months (around 09/07/2017) for CPE.

## 2017-03-09 ENCOUNTER — Encounter: Payer: Self-pay | Admitting: Adult Health

## 2017-03-09 ENCOUNTER — Ambulatory Visit: Payer: BC Managed Care – PPO | Admitting: Adult Health

## 2017-03-09 VITALS — BP 107/75 | HR 85 | Ht 68.0 in | Wt 138.9 lb

## 2017-03-09 DIAGNOSIS — L309 Dermatitis, unspecified: Secondary | ICD-10-CM | POA: Diagnosis not present

## 2017-03-09 DIAGNOSIS — B350 Tinea barbae and tinea capitis: Secondary | ICD-10-CM

## 2017-03-09 DIAGNOSIS — Z0000 Encounter for general adult medical examination without abnormal findings: Secondary | ICD-10-CM | POA: Insufficient documentation

## 2017-03-09 MED ORDER — KETOCONAZOLE 2 % EX SHAM: 1.0000 "application " | MEDICATED_SHAMPOO | CUTANEOUS | 0 refills | Status: DC

## 2017-03-09 MED ORDER — DESONIDE 0.05 % EX LOTN: TOPICAL_LOTION | Freq: Two times a day (BID) | CUTANEOUS | 0 refills | Status: DC

## 2017-03-09 NOTE — Assessment & Plan Note (Signed)
Ketoconazole 2% shampoo as directed

## 2017-03-09 NOTE — Assessment & Plan Note (Signed)
Desonide 0.05% lotion as directed

## 2017-03-09 NOTE — Assessment & Plan Note (Signed)
Continue your excellent water intake and healthy eating. Please return in the next few weeks for labs and full physical in 6 months.

## 2017-03-09 NOTE — Patient Instructions (Addendum)
Heart-Healthy Eating Plan Many factors influence your heart health, including eating and exercise habits. Heart (coronary) risk increases with abnormal blood fat (lipid) levels. Heart-healthy meal planning includes limiting unhealthy fats, increasing healthy fats, and making other small dietary changes. This includes maintaining a healthy body weight to help keep lipid levels within a normal range. What is my plan? Your health care provider recommends that you:  Get no more than ___25___% of the total calories in your daily diet from fat.  Limit your intake of saturated fat to less than ____5__% of your total calories each day.  Limit the amount of cholesterol in your diet to less than ___300___ mg per day.  What types of fat should I choose?  Choose healthy fats more often. Choose monounsaturated and polyunsaturated fats, such as olive oil and canola oil, flaxseeds, walnuts, almonds, and seeds.  Eat more omega-3 fats. Good choices include salmon, mackerel, sardines, tuna, flaxseed oil, and ground flaxseeds. Aim to eat fish at least two times each week.  Limit saturated fats. Saturated fats are primarily found in animal products, such as meats, butter, and cream. Plant sources of saturated fats include palm oil, palm kernel oil, and coconut oil.  Avoid foods with partially hydrogenated oils in them. These contain trans fats. Examples of foods that contain trans fats are stick margarine, some tub margarines, cookies, crackers, and other baked goods. What general guidelines do I need to follow?  Check food labels carefully to identify foods with trans fats or high amounts of saturated fat.  Fill one half of your plate with vegetables and green salads. Eat 4-5 servings of vegetables per day. A serving of vegetables equals 1 cup of raw leafy vegetables,  cup of raw or cooked cut-up vegetables, or  cup of vegetable juice.  Fill one fourth of your plate with whole grains. Look for the word  "whole" as the first word in the ingredient list.  Fill one fourth of your plate with lean protein foods.  Eat 4-5 servings of fruit per day. A serving of fruit equals one medium whole fruit,  cup of dried fruit,  cup of fresh, frozen, or canned fruit, or  cup of 100% fruit juice.  Eat more foods that contain soluble fiber. Examples of foods that contain this type of fiber are apples, broccoli, carrots, beans, peas, and barley. Aim to get 20-30 g of fiber per day.  Eat more home-cooked food and less restaurant, buffet, and fast food.  Limit or avoid alcohol.  Limit foods that are high in starch and sugar.  Avoid fried foods.  Cook foods by using methods other than frying. Baking, boiling, grilling, and broiling are all great options. Other fat-reducing suggestions include: ? Removing the skin from poultry. ? Removing all visible fats from meats. ? Skimming the fat off of stews, soups, and gravies before serving them. ? Steaming vegetables in water or broth.  Lose weight if you are overweight. Losing just 5-10% of your initial body weight can help your overall health and prevent diseases such as diabetes and heart disease.  Increase your consumption of nuts, legumes, and seeds to 4-5 servings per week. One serving of dried beans or legumes equals  cup after being cooked, one serving of nuts equals 1 ounces, and one serving of seeds equals  ounce or 1 tablespoon.  You may need to monitor your salt (sodium) intake, especially if you have high blood pressure. Talk with your health care provider or dietitian to get  more information about reducing sodium. What foods can I eat? Grains  Breads, including French, white, pita, wheat, raisin, rye, oatmeal, and Italian. Tortillas that are neither fried nor made with lard or trans fat. Low-fat rolls, including hotdog and hamburger buns and English muffins. Biscuits. Muffins. Waffles. Pancakes. Light popcorn. Whole-grain cereals. Flatbread.  Melba toast. Pretzels. Breadsticks. Rusks. Low-fat snacks and crackers, including oyster, saltine, matzo, graham, animal, and rye. Rice and pasta, including brown rice and those that are made with whole wheat. Vegetables All vegetables. Fruits All fruits, but limit coconut. Meats and Other Protein Sources Lean, well-trimmed beef, veal, pork, and lamb. Chicken and turkey without skin. All fish and shellfish. Wild duck, rabbit, pheasant, and venison. Egg whites or low-cholesterol egg substitutes. Dried beans, peas, lentils, and tofu.Seeds and most nuts. Dairy Low-fat or nonfat cheeses, including ricotta, string, and mozzarella. Skim or 1% milk that is liquid, powdered, or evaporated. Buttermilk that is made with low-fat milk. Nonfat or low-fat yogurt. Beverages Mineral water. Diet carbonated beverages. Sweets and Desserts Sherbets and fruit ices. Honey, jam, marmalade, jelly, and syrups. Meringues and gelatins. Pure sugar candy, such as hard candy, jelly beans, gumdrops, mints, marshmallows, and small amounts of dark chocolate. Angel food cake. Eat all sweets and desserts in moderation. Fats and Oils Nonhydrogenated (trans-free) margarines. Vegetable oils, including soybean, sesame, sunflower, olive, peanut, safflower, corn, canola, and cottonseed. Salad dressings or mayonnaise that are made with a vegetable oil. Limit added fats and oils that you use for cooking, baking, salads, and as spreads. Other Cocoa powder. Coffee and tea. All seasonings and condiments. The items listed above may not be a complete list of recommended foods or beverages. Contact your dietitian for more options. What foods are not recommended? Grains Breads that are made with saturated or trans fats, oils, or whole milk. Croissants. Butter rolls. Cheese breads. Sweet rolls. Donuts. Buttered popcorn. Chow mein noodles. High-fat crackers, such as cheese or butter crackers. Meats and Other Protein Sources Fatty meats, such  as hotdogs, short ribs, sausage, spareribs, bacon, ribeye roast or steak, and mutton. High-fat deli meats, such as salami and bologna. Caviar. Domestic duck and goose. Organ meats, such as kidney, liver, sweetbreads, brains, gizzard, chitterlings, and heart. Dairy Cream, sour cream, cream cheese, and creamed cottage cheese. Whole milk cheeses, including blue (bleu), Monterey Jack, Brie, [place], American, Havarti, Swiss, cheddar, Camembert, and [place]. Whole or 2% milk that is liquid, evaporated, or condensed. Whole buttermilk. Cream sauce or high-fat cheese sauce. Yogurt that is made from whole milk. Beverages Regular sodas and drinks with added sugar. Sweets and Desserts Frosting. Pudding. Cookies. Cakes other than angel food cake. Candy that has milk chocolate or white chocolate, hydrogenated fat, butter, coconut, or unknown ingredients. Buttered syrups. Full-fat ice cream or ice cream drinks. Fats and Oils Gravy that has suet, meat fat, or shortening. Cocoa butter, hydrogenated oils, palm oil, coconut oil, palm kernel oil. These can often be found in baked products, candy, fried foods, nondairy creamers, and whipped toppings. Solid fats and shortenings, including bacon fat, salt pork, lard, and butter. Nondairy cream substitutes, such as coffee creamers and sour cream substitutes. Salad dressings that are made of unknown oils, cheese, or sour cream. The items listed above may not be a complete list of foods and beverages to avoid. Contact your dietitian for more information. This information is not intended to replace advice given to you by your health care provider. Make sure you discuss any questions you have with your health care   provider. Document Released: 12/17/2007 Document Revised: 09/27/2015 Document Reviewed: 08/31/2013 Elsevier Interactive Patient Education  2017 [employer]  Eczema Eczema, also called atopic dermatitis, is a skin disorder that causes inflammation of the skin. It  causes a red rash and dry, scaly skin. The skin becomes very itchy. Eczema is generally worse during the cooler winter months and often improves with the warmth of summer. Eczema usually starts showing signs in infancy. Some children outgrow eczema, but it may last through adulthood. What are the causes? The exact cause of eczema is not known, but it appears to run in families. People with eczema often have a family history of eczema, allergies, asthma, or hay fever. Eczema is not contagious. Flare-ups of the condition may be caused by:  Contact with something you are sensitive or allergic to.  Stress.  What are the signs or symptoms?  Dry, scaly skin.  Red, itchy rash.  Itchiness. This may occur before the skin rash and may be very intense. How is this diagnosed? The diagnosis of eczema is usually made based on symptoms and medical history. How is this treated? Eczema cannot be cured, but symptoms usually can be controlled with treatment and other strategies. A treatment plan might include:  Controlling the itching and scratching. ? Use over-the-counter antihistamines as directed for itching. This is especially useful at night when the itching tends to be worse. ? Use over-the-counter steroid creams as directed for itching. ? Avoid scratching. Scratching makes the rash and itching worse. It may also result in a skin infection (impetigo) due to a break in the skin caused by scratching.  Keeping the skin well moisturized with creams every day. This will seal in moisture and help prevent dryness. Lotions that contain alcohol and water should be avoided because they can dry the skin.  Limiting exposure to things that you are sensitive or allergic to (allergens).  Recognizing situations that cause stress.  Developing a plan to manage stress.  Follow these instructions at home:  Only take over-the-counter or prescription medicines as directed by your health care provider.  Do not use  anything on the skin without checking with your health care provider.  Keep baths or showers short (5 minutes) in warm (not hot) water. Use mild cleansers for bathing. These should be unscented. You may add nonperfumed bath oil to the bath water. It is best to avoid soap and bubble bath.  Immediately after a bath or shower, when the skin is still damp, apply a moisturizing ointment to the entire body. This ointment should be a petroleum ointment. This will seal in moisture and help prevent dryness. The thicker the ointment, the better. These should be unscented.  Keep fingernails cut short. Children with eczema may need to wear soft gloves or mittens at night after applying an ointment.  Dress in clothes made of cotton or cotton blends. Dress lightly, because heat increases itching.  A child with eczema should stay away from anyone with fever blisters or cold sores. The virus that causes fever blisters (herpes simplex) can cause a serious skin infection in children with eczema. Contact a health care provider if:  Your itching interferes with sleep.  Your rash gets worse or is not better within 1 week after starting treatment.  You see pus or soft yellow scabs in the rash area.  You have a fever.  You have a rash flare-up after contact with someone who has fever blisters. This information is not intended to replace advice given  to you by your health care provider. Make sure you discuss any questions you have with your health care provider. Document Released: 03/06/2000 Document Revised: 08/15/2015 Document Reviewed: 10/10/2012 Elsevier Interactive Patient Education  2017 [employer]  Please use ointment and shampoo as directed. Continue your excellent water intake and healthy eating. Please return in the next few weeks for labs and full physical in 6 months. WELCOME TO THE PRACTICE!

## 2017-04-01 ENCOUNTER — Other Ambulatory Visit: Payer: BC Managed Care – PPO

## 2017-04-01 DIAGNOSIS — Z0000 Encounter for general adult medical examination without abnormal findings: Secondary | ICD-10-CM

## 2017-04-02 ENCOUNTER — Other Ambulatory Visit: Payer: Self-pay | Admitting: Adult Health

## 2017-04-02 DIAGNOSIS — E559 Vitamin D deficiency, unspecified: Secondary | ICD-10-CM

## 2017-04-02 LAB — LIPID PANEL
CHOLESTEROL TOTAL: 175 mg/dL (ref 100–199)
Chol/HDL Ratio: 3.1 ratio (ref 0.0–4.4)
HDL: 57 mg/dL (ref >39)
LDL Calculated: 103 mg/dL — ABNORMAL HIGH (ref 0–99)
Triglycerides: 75 mg/dL (ref 0–149)
VLDL CHOLESTEROL CAL: 15 mg/dL (ref 5–40)

## 2017-04-02 LAB — CBC WITH DIFFERENTIAL/PLATELET
BASOS ABS: 0 10*3/uL (ref 0.0–0.2)
Basos: 1 %
EOS (ABSOLUTE): 0.1 10*3/uL (ref 0.0–0.4)
Eos: 2 %
HEMOGLOBIN: 13.9 g/dL (ref 11.1–15.9)
Hematocrit: 40.3 % (ref 34.0–46.6)
IMMATURE GRANULOCYTES: 0 %
Immature Grans (Abs): 0 10*3/uL (ref 0.0–0.1)
LYMPHS: 30 %
Lymphocytes Absolute: 1.6 10*3/uL (ref 0.7–3.1)
MCH: 30.8 pg (ref 26.6–33.0)
MCHC: 34.5 g/dL (ref 31.5–35.7)
MCV: 89 fL (ref 79–97)
MONOCYTES: 10 %
Monocytes Absolute: 0.5 10*3/uL (ref 0.1–0.9)
NEUTROS ABS: 3.1 10*3/uL (ref 1.4–7.0)
NEUTROS PCT: 57 %
PLATELETS: 234 10*3/uL (ref 150–379)
RBC: 4.52 x10E6/uL (ref 3.77–5.28)
RDW: 12.9 % (ref 12.3–15.4)
WBC: 5.4 10*3/uL (ref 3.4–10.8)

## 2017-04-02 LAB — COMPREHENSIVE METABOLIC PANEL
A/G RATIO: 1.4 (ref 1.2–2.2)
ALT: 12 IU/L (ref 0–32)
AST: 18 IU/L (ref 0–40)
Albumin: 4.3 g/dL (ref 3.5–5.5)
Alkaline Phosphatase: 54 IU/L (ref 39–117)
BILIRUBIN TOTAL: 0.6 mg/dL (ref 0.0–1.2)
BUN/Creatinine Ratio: 11 (ref 9–23)
BUN: 9 mg/dL (ref 6–20)
CALCIUM: 9.1 mg/dL (ref 8.7–10.2)
CHLORIDE: 109 mmol/L — AB (ref 96–106)
CO2: 21 mmol/L (ref 20–29)
Creatinine, Ser: 0.85 mg/dL (ref 0.57–1.00)
GFR, EST AFRICAN AMERICAN: 103 mL/min/{1.73_m2} (ref >59)
GFR, EST NON AFRICAN AMERICAN: 90 mL/min/{1.73_m2} (ref >59)
GLUCOSE: 80 mg/dL (ref 65–99)
Globulin, Total: 3 g/dL (ref 1.5–4.5)
POTASSIUM: 4.8 mmol/L (ref 3.5–5.2)
Sodium: 144 mmol/L (ref 134–144)
TOTAL PROTEIN: 7.3 g/dL (ref 6.0–8.5)

## 2017-04-02 LAB — TSH: TSH: 2.65 u[IU]/mL (ref 0.450–4.500)

## 2017-04-02 LAB — HEMOGLOBIN A1C
ESTIMATED AVERAGE GLUCOSE: 91 mg/dL
HEMOGLOBIN A1C: 4.8 % (ref 4.8–5.6)

## 2017-04-02 LAB — VITAMIN D 25 HYDROXY (VIT D DEFICIENCY, FRACTURES): VIT D 25 HYDROXY: 29.7 ng/mL — AB (ref 30.0–100.0)

## 2017-04-02 MED ORDER — VITAMIN D (ERGOCALCIFEROL) 1.25 MG (50000 UNIT) PO CAPS: 50000.0000 [IU] | ORAL_CAPSULE | ORAL | 0 refills | Status: DC

## 2017-04-05 ENCOUNTER — Telehealth: Payer: Self-pay | Admitting: Adult Health

## 2017-04-05 NOTE — Telephone Encounter (Signed)
Patient returned call as you were pulling another patient back, she said call back when available

## 2017-04-05 NOTE — Telephone Encounter (Signed)
See results note.  T. Kalei Meda, CMA 

## 2017-06-20 ENCOUNTER — Other Ambulatory Visit: Payer: Self-pay | Admitting: Adult Health

## 2017-09-07 ENCOUNTER — Ambulatory Visit: Payer: BC Managed Care – PPO | Admitting: Adult Health

## 2017-09-07 ENCOUNTER — Encounter: Payer: Self-pay | Admitting: Adult Health

## 2017-09-07 VITALS — BP 113/76 | HR 73 | Ht 67.99 in | Wt 147.0 lb

## 2017-09-07 DIAGNOSIS — E559 Vitamin D deficiency, unspecified: Secondary | ICD-10-CM | POA: Insufficient documentation

## 2017-09-07 DIAGNOSIS — K649 Unspecified hemorrhoids: Secondary | ICD-10-CM | POA: Diagnosis not present

## 2017-09-07 DIAGNOSIS — Z0000 Encounter for general adult medical examination without abnormal findings: Secondary | ICD-10-CM

## 2017-09-07 DIAGNOSIS — L309 Dermatitis, unspecified: Secondary | ICD-10-CM | POA: Diagnosis not present

## 2017-09-07 MED ORDER — DESONIDE 0.05 % EX LOTN: TOPICAL_LOTION | Freq: Two times a day (BID) | CUTANEOUS | 2 refills | Status: DC

## 2017-09-07 NOTE — Patient Instructions (Addendum)
Preventive Care for Adults, Female  A healthy lifestyle and preventive care can promote health and wellness. Preventive health guidelines for women include the following key practices.   A routine yearly physical is a good way to check with your health care provider about your health and preventive screening. It is a chance to share any concerns and updates on your health and to receive a thorough exam.   Visit your dentist for a routine exam and preventive care every 6 months. Brush your teeth twice a day and floss once a day. Good oral hygiene prevents tooth decay and gum disease.   The frequency of eye exams is based on your age, health, family medical history, use of contact lenses, and other factors. Follow your health care provider's recommendations for frequency of eye exams.   Eat a healthy diet. Foods like vegetables, fruits, whole grains, low-fat dairy products, and lean protein foods contain the nutrients you need without too many calories. Decrease your intake of foods high in solid fats, added sugars, and salt. Eat the right amount of calories for you.Get information about a proper diet from your health care provider, if necessary.   Regular physical exercise is one of the most important things you can do for your health. Most adults should get at least 150 minutes of moderate-intensity exercise (any activity that increases your heart rate and causes you to sweat) each week. In addition, most adults need muscle-strengthening exercises on 2 or more days a week.   Maintain a healthy weight. The body mass index (BMI) is a screening tool to identify possible weight problems. It provides an estimate of body fat based on height and weight. Your health care provider can find your BMI, and can help you achieve or maintain a healthy weight.For adults 20 years and older:   - A BMI below 18.5 is considered underweight.   - A BMI of 18.5 to 24.9 is normal.   - A BMI of 25 to 29.9 is  considered overweight.   - A BMI of 30 and above is considered obese.   Maintain normal blood lipids and cholesterol levels by exercising and minimizing your intake of trans and saturated fats.  Eat a balanced diet with plenty of fruit and vegetables. Blood tests for lipids and cholesterol should begin at age 20 and be repeated every 5 years minimum.  If your lipid or cholesterol levels are high, you are over 40, or you are at high risk for heart disease, you may need your cholesterol levels checked more frequently.Ongoing high lipid and cholesterol levels should be treated with medicines if diet and exercise are not working.   If you smoke, find out from your health care provider how to quit. If you do not use tobacco, do not start.   Lung cancer screening is recommended for adults aged 55-80 years who are at high risk for developing lung cancer because of a history of smoking. A yearly low-dose CT scan of the lungs is recommended for people who have at least a 30-pack-year history of smoking and are a current smoker or have quit within the past 15 years. A pack year of smoking is smoking an average of 1 pack of cigarettes a day for 1 year (for example: 1 pack a day for 30 years or 2 packs a day for 15 years). Yearly screening should continue until the smoker has stopped smoking for at least 15 years. Yearly screening should be stopped for people who develop a   health problem that would prevent them from having lung cancer treatment.   If you are pregnant, do not drink alcohol. If you are breastfeeding, be very cautious about drinking alcohol. If you are not pregnant and choose to drink alcohol, do not have more than 1 drink per day. One drink is considered to be 12 ounces (355 mL) of beer, 5 ounces (148 mL) of wine, or 1.5 ounces (44 mL) of liquor.   Avoid use of street drugs. Do not share needles with anyone. Ask for help if you need support or instructions about stopping the use of  drugs.   High blood pressure causes heart disease and increases the risk of stroke. Your blood pressure should be checked at least yearly.  Ongoing high blood pressure should be treated with medicines if weight loss and exercise do not work.   If you are 69-55 years old, ask your health care provider if you should take aspirin to prevent strokes.   Diabetes screening involves taking a blood sample to check your fasting blood sugar level. This should be done once every 3 years, after age 38, if you are within normal weight and without risk factors for diabetes. Testing should be considered at a younger age or be carried out more frequently if you are overweight and have at least 1 risk factor for diabetes.   Breast cancer screening is essential preventive care for women. You should practice "breast self-awareness."  This means understanding the normal appearance and feel of your breasts and may include breast self-examination.  Any changes detected, no matter how small, should be reported to a health care provider.  Women in their 80s and 30s should have a clinical breast exam (CBE) by a health care provider as part of a regular health exam every 1 to 3 years.  After age 66, women should have a CBE every year.  Starting at age 1, women should consider having a mammogram (breast X-ray test) every year.  Women who have a family history of breast cancer should talk to their health care provider about genetic screening.  Women at a high risk of breast cancer should talk to their health care providers about having an MRI and a mammogram every year.   -Breast cancer gene (BRCA)-related cancer risk assessment is recommended for women who have family members with BRCA-related cancers. BRCA-related cancers include breast, ovarian, tubal, and peritoneal cancers. Having family members with these cancers may be associated with an increased risk for harmful changes (mutations) in the breast cancer genes BRCA1 and  BRCA2. Results of the assessment will determine the need for genetic counseling and BRCA1 and BRCA2 testing.   The Pap test is a screening test for cervical cancer. A Pap test can show cell changes on the cervix that might become cervical cancer if left untreated. A Pap test is a procedure in which cells are obtained and examined from the lower end of the uterus (cervix).   - Women should have a Pap test starting at age 57.   - Between ages 90 and 70, Pap tests should be repeated every 2 years.   - Beginning at age 63, you should have a Pap test every 3 years as long as the past 3 Pap tests have been normal.   - Some women have medical problems that increase the chance of getting cervical cancer. Talk to your health care provider about these problems. It is especially important to talk to your health care provider if a  new problem develops soon after your last Pap test. In these cases, your health care provider may recommend more frequent screening and Pap tests.   - The above recommendations are the same for women who have or have not gotten the vaccine for human papillomavirus (HPV).   - If you had a hysterectomy for a problem that was not cancer or a condition that could lead to cancer, then you no longer need Pap tests. Even if you no longer need a Pap test, a regular exam is a good idea to make sure no other problems are starting.   - If you are between ages 36 and 66 years, and you have had normal Pap tests going back 10 years, you no longer need Pap tests. Even if you no longer need a Pap test, a regular exam is a good idea to make sure no other problems are starting.   - If you have had past treatment for cervical cancer or a condition that could lead to cancer, you need Pap tests and screening for cancer for at least 20 years after your treatment.   - If Pap tests have been discontinued, risk factors (such as a new sexual partner) need to be reassessed to determine if screening should  be resumed.   - The HPV test is an additional test that may be used for cervical cancer screening. The HPV test looks for the virus that can cause the cell changes on the cervix. The cells collected during the Pap test can be tested for HPV. The HPV test could be used to screen women aged 70 years and older, and should be used in women of any age who have unclear Pap test results. After the age of 67, women should have HPV testing at the same frequency as a Pap test.   Colorectal cancer can be detected and often prevented. Most routine colorectal cancer screening begins at the age of 57 years and continues through age 26 years. However, your health care provider may recommend screening at an earlier age if you have risk factors for colon cancer. On a yearly basis, your health care provider may provide home test kits to check for hidden blood in the stool.  Use of a small camera at the end of a tube, to directly examine the colon (sigmoidoscopy or colonoscopy), can detect the earliest forms of colorectal cancer. Talk to your health care provider about this at age 23, when routine screening begins. Direct exam of the colon should be repeated every 5 -10 years through age 49 years, unless early forms of pre-cancerous polyps or small growths are found.   People who are at an increased risk for hepatitis B should be screened for this virus. You are considered at high risk for hepatitis B if:  -You were born in a country where hepatitis B occurs often. Talk with your health care provider about which countries are considered high risk.  - Your parents were born in a high-risk country and you have not received a shot to protect against hepatitis B (hepatitis B vaccine).  - You have HIV or AIDS.  - You use needles to inject street drugs.  - You live with, or have sex with, someone who has Hepatitis B.  - You get hemodialysis treatment.  - You take certain medicines for conditions like cancer, organ  transplantation, and autoimmune conditions.   Hepatitis C blood testing is recommended for all people born from [birth year] through 1965 and any individual  with known risks for hepatitis C.   Practice safe sex. Use condoms and avoid high-risk sexual practices to reduce the spread of sexually transmitted infections (STIs). STIs include gonorrhea, chlamydia, syphilis, trichomonas, herpes, HPV, and human immunodeficiency virus (HIV). Herpes, HIV, and HPV are viral illnesses that have no cure. They can result in disability, cancer, and death. Sexually active women aged 25 years and younger should be checked for chlamydia. Older women with new or multiple partners should also be tested for chlamydia. Testing for other STIs is recommended if you are sexually active and at increased risk.   Osteoporosis is a disease in which the bones lose minerals and strength with aging. This can result in serious bone fractures or breaks. The risk of osteoporosis can be identified using a bone density scan. Women ages 65 years and over and women at risk for fractures or osteoporosis should discuss screening with their health care providers. Ask your health care provider whether you should take a calcium supplement or vitamin D to There are also several preventive steps women can take to avoid osteoporosis and resulting fractures or to keep osteoporosis from worsening. -->Recommendations include:  Eat a balanced diet high in fruits, vegetables, calcium, and vitamins.  Get enough calcium. The recommended total intake of is 1,200 mg daily; for best absorption, if taking supplements, divide doses into 250-500 mg doses throughout the day. Of the two types of calcium, calcium carbonate is best absorbed when taken with food but calcium citrate can be taken on an empty stomach.  Get enough vitamin D. NAMS and the National Osteoporosis Foundation recommend at least 1,000 IU per day for women age 50 and over who are at risk of vitamin D  deficiency. Vitamin D deficiency can be caused by inadequate sun exposure (for example, those who live in northern latitudes).  Avoid alcohol and smoking. Heavy alcohol intake (more than 7 drinks per week) increases the risk of falls and hip fracture and women smokers tend to lose bone more rapidly and have lower bone mass than nonsmokers. Stopping smoking is one of the most important changes women can make to improve their health and decrease risk for disease.  Be physically active every day. Weight-bearing exercise (for example, fast walking, hiking, jogging, and weight training) may strengthen bones or slow the rate of bone loss that comes with aging. Balancing and muscle-strengthening exercises can reduce the risk of falling and fracture.  Consider therapeutic medications. Currently, several types of effective drugs are available. Healthcare providers can recommend the type most appropriate for each woman.  Eliminate environmental factors that may contribute to accidents. Falls cause nearly 90% of all osteoporotic fractures, so reducing this risk is an important bone-health strategy. Measures include ample lighting, removing obstructions to walking, using nonskid rugs on floors, and placing mats and/or grab bars in showers.  Be aware of medication side effects. Some common medicines make bones weaker. These include a type of steroid drug called glucocorticoids used for arthritis and asthma, some antiseizure drugs, certain sleeping pills, treatments for endometriosis, and some cancer drugs. An overactive thyroid gland or using too much thyroid hormone for an underactive thyroid can also be a problem. If you are taking these medicines, talk to your doctor about what you can do to help protect your bones.reduce the rate of osteoporosis.    Menopause can be associated with physical symptoms and risks. Hormone replacement therapy is available to decrease symptoms and risks. You should talk to your  health care provider   about whether hormone replacement therapy is right for you.   Use sunscreen. Apply sunscreen liberally and repeatedly throughout the day. You should seek shade when your shadow is shorter than you. Protect yourself by wearing long sleeves, pants, a wide-brimmed hat, and sunglasses year round, whenever you are outdoors.   Once a month, do a whole body skin exam, using a mirror to look at the skin on your back. Tell your health care provider of new moles, moles that have irregular borders, moles that are larger than a pencil eraser, or moles that have changed in shape or color.   -Stay current with required vaccines (immunizations).   Influenza vaccine. All adults should be immunized every year.  Tetanus, diphtheria, and acellular pertussis (Td, Tdap) vaccine. Pregnant women should receive 1 dose of Tdap vaccine during each pregnancy. The dose should be obtained regardless of the length of time since the last dose. Immunization is preferred during the 27th 36th week of gestation. An adult who has not previously received Tdap or who does not know her vaccine status should receive 1 dose of Tdap. This initial dose should be followed by tetanus and diphtheria toxoids (Td) booster doses every 10 years. Adults with an unknown or incomplete history of completing a 3-dose immunization series with Td-containing vaccines should begin or complete a primary immunization series including a Tdap dose. Adults should receive a Td booster every 10 years.  Varicella vaccine. An adult without evidence of immunity to varicella should receive 2 doses or a second dose if she has previously received 1 dose. Pregnant females who do not have evidence of immunity should receive the first dose after pregnancy. This first dose should be obtained before leaving the health care facility. The second dose should be obtained 4 8 weeks after the first dose.  Human papillomavirus (HPV) vaccine. Females aged 13 26  years who have not received the vaccine previously should obtain the 3-dose series. The vaccine is not recommended for use in pregnant females. However, pregnancy testing is not needed before receiving a dose. If a female is found to be pregnant after receiving a dose, no treatment is needed. In that case, the remaining doses should be delayed until after the pregnancy. Immunization is recommended for any person with an immunocompromised condition through the age of 26 years if she did not get any or all doses earlier. During the 3-dose series, the second dose should be obtained 4 8 weeks after the first dose. The third dose should be obtained 24 weeks after the first dose and 16 weeks after the second dose.  Zoster vaccine. One dose is recommended for adults aged 60 years or older unless certain conditions are present.  Measles, mumps, and rubella (MMR) vaccine. Adults born before 1957 generally are considered immune to measles and mumps. Adults born in 1957 or later should have 1 or more doses of MMR vaccine unless there is a contraindication to the vaccine or there is laboratory evidence of immunity to each of the three diseases. A routine second dose of MMR vaccine should be obtained at least 28 days after the first dose for students attending postsecondary schools, health care workers, or international travelers. People who received inactivated measles vaccine or an unknown type of measles vaccine during 1963 1967 should receive 2 doses of MMR vaccine. People who received inactivated mumps vaccine or an unknown type of mumps vaccine before 1979 and are at high risk for mumps infection should consider immunization with 2 doses of   MMR vaccine. For females of childbearing age, rubella immunity should be determined. If there is no evidence of immunity, females who are not pregnant should be vaccinated. If there is no evidence of immunity, females who are pregnant should delay immunization until after pregnancy.  Unvaccinated health care workers born before [birth year] who lack laboratory evidence of measles, mumps, or rubella immunity or laboratory confirmation of disease should consider measles and mumps immunization with 2 doses of MMR vaccine or rubella immunization with 1 dose of MMR vaccine.  Pneumococcal 13-valent conjugate (PCV13) vaccine. When indicated, a person who is uncertain of her immunization history and has no record of immunization should receive the PCV13 vaccine. An adult aged 54 years or older who has certain medical conditions and has not been previously immunized should receive 1 dose of PCV13 vaccine. This PCV13 should be followed with a dose of pneumococcal polysaccharide (PPSV23) vaccine. The PPSV23 vaccine dose should be obtained at least 8 weeks after the dose of PCV13 vaccine. An adult aged 58 years or older who has certain medical conditions and previously received 1 or more doses of PPSV23 vaccine should receive 1 dose of PCV13. The PCV13 vaccine dose should be obtained 1 or more years after the last PPSV23 vaccine dose.  Pneumococcal polysaccharide (PPSV23) vaccine. When PCV13 is also indicated, PCV13 should be obtained first. All adults aged 58 years and older should be immunized. An adult younger than age 65 years who has certain medical conditions should be immunized. Any person who resides in a nursing home or long-term care facility should be immunized. An adult smoker should be immunized. People with an immunocompromised condition and certain other conditions should receive both PCV13 and PPSV23 vaccines. People with human immunodeficiency virus (HIV) infection should be immunized as soon as possible after diagnosis. Immunization during chemotherapy or radiation therapy should be avoided. Routine use of PPSV23 vaccine is not recommended for American Indians, [place] Natives, or people younger than 65 years unless there are medical conditions that require PPSV23 vaccine. When indicated,  people who have unknown immunization and have no record of immunization should receive PPSV23 vaccine. One-time revaccination 5 years after the first dose of PPSV23 is recommended for people aged 70 64 years who have chronic kidney failure, nephrotic syndrome, asplenia, or immunocompromised conditions. People who received 1 2 doses of PPSV23 before age 32 years should receive another dose of PPSV23 vaccine at age 96 years or later if at least 5 years have passed since the previous dose. Doses of PPSV23 are not needed for people immunized with PPSV23 at or after age 55 years.  Meningococcal vaccine. Adults with asplenia or persistent complement component deficiencies should receive 2 doses of quadrivalent meningococcal conjugate (MenACWY-D) vaccine. The doses should be obtained at least 2 months apart. Microbiologists working with certain meningococcal bacteria, [employer] recruits, people at risk during an outbreak, and people who travel to or live in countries with a high rate of meningitis should be immunized. A first-year college student up through age 58 years who is living in a residence hall should receive a dose if she did not receive a dose on or after her 16th birthday. Adults who have certain high-risk conditions should receive one or more doses of vaccine.  Hepatitis A vaccine. Adults who wish to be protected from this disease, have certain high-risk conditions, work with hepatitis A-infected animals, work in hepatitis A research labs, or travel to or work in countries with a high rate of hepatitis A should be  immunized. Adults who were previously unvaccinated and who anticipate close contact with an international adoptee during the first 60 days after arrival in the [place] from a country with a high rate of hepatitis A should be immunized.  Hepatitis B vaccine.  Adults who wish to be protected from this disease, have certain high-risk conditions, may be exposed to blood or other infectious  body fluids, are household contacts or sex partners of hepatitis B positive people, are clients or workers in certain care facilities, or travel to or work in countries with a high rate of hepatitis B should be immunized.  Haemophilus influenzae type b (Hib) vaccine. A previously unvaccinated person with asplenia or sickle cell disease or having a scheduled splenectomy should receive 1 dose of Hib vaccine. Regardless of previous immunization, a recipient of a hematopoietic stem cell transplant should receive a 3-dose series 6 12 months after her successful transplant. Hib vaccine is not recommended for adults with HIV infection.  Preventive Services / Frequency Ages 6 to 39years  Blood pressure check.** / Every 1 to 2 years.  Lipid and cholesterol check.** / Every 5 years beginning at age 39.  Clinical breast exam.** / Every 3 years for women in their 61s and 62s.  BRCA-related cancer risk assessment.** / For women who have family members with a BRCA-related cancer (breast, ovarian, tubal, or peritoneal cancers).  Pap test.** / Every 2 years from ages 47 through 85. Every 3 years starting at age 34 through age 12 or 74 with a history of 3 consecutive normal Pap tests.  HPV screening.** / Every 3 years from ages 46 through ages 43 to 54 with a history of 3 consecutive normal Pap tests.  Hepatitis C blood test.** / For any individual with known risks for hepatitis C.  Skin self-exam. / Monthly.  Influenza vaccine. / Every year.  Tetanus, diphtheria, and acellular pertussis (Tdap, Td) vaccine.** / Consult your health care provider. Pregnant women should receive 1 dose of Tdap vaccine during each pregnancy. 1 dose of Td every 10 years.  Varicella vaccine.** / Consult your health care provider. Pregnant females who do not have evidence of immunity should receive the first dose after pregnancy.  HPV vaccine. / 3 doses over 6 months, if 64 and younger. The vaccine is not recommended for use in  pregnant females. However, pregnancy testing is not needed before receiving a dose.  Measles, mumps, rubella (MMR) vaccine.** / You need at least 1 dose of MMR if you were born in 1957 or later. You may also need a 2nd dose. For females of childbearing age, rubella immunity should be determined. If there is no evidence of immunity, females who are not pregnant should be vaccinated. If there is no evidence of immunity, females who are pregnant should delay immunization until after pregnancy.  Pneumococcal 13-valent conjugate (PCV13) vaccine.** / Consult your health care provider.  Pneumococcal polysaccharide (PPSV23) vaccine.** / 1 to 2 doses if you smoke cigarettes or if you have certain conditions.  Meningococcal vaccine.** / 1 dose if you are age 71 to 37 years and a Market researcher living in a residence hall, or have one of several medical conditions, you need to get vaccinated against meningococcal disease. You may also need additional booster doses.  Hepatitis A vaccine.** / Consult your health care provider.  Hepatitis B vaccine.** / Consult your health care provider.  Haemophilus influenzae type b (Hib) vaccine.** / Consult your health care provider.  Ages 55 to 64years  Blood pressure check.** / Every 1 to 2 years.  Lipid and cholesterol check.** / Every 5 years beginning at age 20 years.  Lung cancer screening. / Every year if you are aged 55 80 years and have a 30-pack-year history of smoking and currently smoke or have quit within the past 15 years. Yearly screening is stopped once you have quit smoking for at least 15 years or develop a health problem that would prevent you from having lung cancer treatment.  Clinical breast exam.** / Every year after age 40 years.  BRCA-related cancer risk assessment.** / For women who have family members with a BRCA-related cancer (breast, ovarian, tubal, or peritoneal cancers).  Mammogram.** / Every year beginning at age 40  years and continuing for as long as you are in good health. Consult with your health care provider.  Pap test.** / Every 3 years starting at age 30 years through age 65 or 70 years with a history of 3 consecutive normal Pap tests.  HPV screening.** / Every 3 years from ages 30 years through ages 65 to 70 years with a history of 3 consecutive normal Pap tests.  Fecal occult blood test (FOBT) of stool. / Every year beginning at age 50 years and continuing until age 75 years. You may not need to do this test if you get a colonoscopy every 10 years.  Flexible sigmoidoscopy or colonoscopy.** / Every 5 years for a flexible sigmoidoscopy or every 10 years for a colonoscopy beginning at age 50 years and continuing until age 75 years.  Hepatitis C blood test.** / For all people born from 1945 through 1965 and any individual with known risks for hepatitis C.  Skin self-exam. / Monthly.  Influenza vaccine. / Every year.  Tetanus, diphtheria, and acellular pertussis (Tdap/Td) vaccine.** / Consult your health care provider. Pregnant women should receive 1 dose of Tdap vaccine during each pregnancy. 1 dose of Td every 10 years.  Varicella vaccine.** / Consult your health care provider. Pregnant females who do not have evidence of immunity should receive the first dose after pregnancy.  Zoster vaccine.** / 1 dose for adults aged 60 years or older.  Measles, mumps, rubella (MMR) vaccine.** / You need at least 1 dose of MMR if you were born in 1957 or later. You may also need a 2nd dose. For females of childbearing age, rubella immunity should be determined. If there is no evidence of immunity, females who are not pregnant should be vaccinated. If there is no evidence of immunity, females who are pregnant should delay immunization until after pregnancy.  Pneumococcal 13-valent conjugate (PCV13) vaccine.** / Consult your health care provider.  Pneumococcal polysaccharide (PPSV23) vaccine.** / 1 to 2 doses if  you smoke cigarettes or if you have certain conditions.  Meningococcal vaccine.** / Consult your health care provider.  Hepatitis A vaccine.** / Consult your health care provider.  Hepatitis B vaccine.** / Consult your health care provider.  Haemophilus influenzae type b (Hib) vaccine.** / Consult your health care provider.  Ages 65 years and over  Blood pressure check.** / Every 1 to 2 years.  Lipid and cholesterol check.** / Every 5 years beginning at age 20 years.  Lung cancer screening. / Every year if you are aged 55 80 years and have a 30-pack-year history of smoking and currently smoke or have quit within the past 15 years. Yearly screening is stopped once you have quit smoking for at least 15 years or develop a health problem that   would prevent you from having lung cancer treatment.  Clinical breast exam.** / Every year after age 103 years.  BRCA-related cancer risk assessment.** / For women who have family members with a BRCA-related cancer (breast, ovarian, tubal, or peritoneal cancers).  Mammogram.** / Every year beginning at age 36 years and continuing for as long as you are in good health. Consult with your health care provider.  Pap test.** / Every 3 years starting at age 5 years through age 85 or 10 years with 3 consecutive normal Pap tests. Testing can be stopped between 65 and 70 years with 3 consecutive normal Pap tests and no abnormal Pap or HPV tests in the past 10 years.  HPV screening.** / Every 3 years from ages 93 years through ages 70 or 45 years with a history of 3 consecutive normal Pap tests. Testing can be stopped between 65 and 70 years with 3 consecutive normal Pap tests and no abnormal Pap or HPV tests in the past 10 years.  Fecal occult blood test (FOBT) of stool. / Every year beginning at age 8 years and continuing until age 45 years. You may not need to do this test if you get a colonoscopy every 10 years.  Flexible sigmoidoscopy or colonoscopy.** /  Every 5 years for a flexible sigmoidoscopy or every 10 years for a colonoscopy beginning at age 69 years and continuing until age 68 years.  Hepatitis C blood test.** / For all people born from [birth year] through 1963 and any individual with known risks for hepatitis C.  Osteoporosis screening.** / A one-time screening for women ages 7 years and over and women at risk for fractures or osteoporosis.  Skin self-exam. / Monthly.  Influenza vaccine. / Every year.  Tetanus, diphtheria, and acellular pertussis (Tdap/Td) vaccine.** / 1 dose of Td every 10 years.  Varicella vaccine.** / Consult your health care provider.  Zoster vaccine.** / 1 dose for adults aged 5 years or older.  Pneumococcal 13-valent conjugate (PCV13) vaccine.** / Consult your health care provider.  Pneumococcal polysaccharide (PPSV23) vaccine.** / 1 dose for all adults aged 74 years and older.  Meningococcal vaccine.** / Consult your health care provider.  Hepatitis A vaccine.** / Consult your health care provider.  Hepatitis B vaccine.** / Consult your health care provider.  Haemophilus influenzae type b (Hib) vaccine.** / Consult your health care provider. ** Family history and personal history of risk and conditions may change your health care provider's recommendations. Document Released: 05/05/2001 Document Revised: 12/28/2012  [facility] Patient Information 2014 [place], [place].   EXERCISE AND DIET:  We recommended that you start or continue a regular exercise program for good health. Regular exercise means any activity that makes your heart beat faster and makes you sweat.  We recommend exercising at least 30 minutes per day at least 3 days a week, preferably 5.  We also recommend a diet low in fat and sugar / carbohydrates.  Inactivity, poor dietary choices and obesity can cause diabetes, heart attack, stroke, and kidney damage, among others.     ALCOHOL AND SMOKING:  Women should limit their alcohol intake to no  more than 7 drinks/beers/glasses of wine (combined, not each!) per week. Moderation of alcohol intake to this level decreases your risk of breast cancer and liver damage.  ( And of course, no recreational drugs are part of a healthy lifestyle.)  Also, you should not be smoking at all or even being exposed to second hand smoke. Most people know smoking can  cause cancer, and various heart and lung diseases, but did you know it also contributes to weakening of your bones?  Aging of your skin?  Yellowing of your teeth and nails?   CALCIUM AND VITAMIN D:  Adequate intake of calcium and Vitamin D are recommended.  The recommendations for exact amounts of these supplements seem to change often, but generally speaking 600 mg of calcium (either carbonate or citrate) and 800 units of Vitamin D per day seems prudent. Certain women may benefit from higher intake of Vitamin D.  If you are among these women, your doctor will have told you during your visit.     PAP SMEARS:  Pap smears, to check for cervical cancer or precancers,  have traditionally been done yearly, although recent scientific advances have shown that most women can have pap smears less often.  However, every woman still should have a physical exam from her gynecologist or primary care physician every year. It will include a breast check, inspection of the vulva and vagina to check for abnormal growths or skin changes, a visual exam of the cervix, and then an exam to evaluate the size and shape of the uterus and ovaries.  And after 35 years of age, a rectal exam is indicated to check for rectal cancers. We will also provide age appropriate advice regarding health maintenance, like when you should have certain vaccines, screening for sexually transmitted diseases, bone density testing, colonoscopy, mammograms, etc.    MAMMOGRAMS:  All women over 4 years old should have a yearly mammogram. Many facilities now offer a "3D" mammogram, which may cost  around $50 extra out of pocket. If possible,  we recommend you accept the option to have the 3D mammogram performed.  It both reduces the number of women who will be called back for extra views which then turn out to be normal, and it is better than the routine mammogram at detecting truly abnormal areas.     COLONOSCOPY:  Colonoscopy to screen for colon cancer is recommended for all women at age 68.  We know, you hate the idea of the prep.  We agree, BUT, having colon cancer and not knowing it is worse!!  Colon cancer so often starts as a polyp that can be seen and removed at colonscopy, which can quite literally save your life!  And if your first colonoscopy is normal and you have no family history of colon cancer, most women don't have to have it again for 10 years.  Once every ten years, you can do something that may end up saving your life, right?  We will be happy to help you get it scheduled when you are ready.  Be sure to check your insurance coverage so you understand how much it will cost.  It may be covered as a preventative service at no cost, but you should check your particular policy.    We will call you when Vit d level results are available. Continue to drink plenty of water and follow heart healthy diet. If hemorrhoids worsen or you notice blood in your stool- please call clinic and we will refer you to gastroenterologist. Recommend annual physical with fasting labs. NICE TO SEE YOU!

## 2017-09-07 NOTE — Progress Notes (Signed)
Subjective:    Patient ID: Miranda Davis, female    DOB: 1980/03/25, 37 y.o.   MRN: [id number]  HPI:03/09/17 OV:   Miranda Davis is here to establish as a new pt.  She is a very pleasant 37 year old female.  EXB:MWUXLK and exercise induced asthma, has "been years since I have needed an inhaler". She estimates to drink >70 oz water/day and follows a heart healthy diet. She is married with three children (ages 26, 73, 2) and works FT as Control and instrumentation engineer".  She walks several days a week and "runs after the kids, constantly".  09/07/17 OV: Miranda Davis is here for CPE She continues to remain well hydrated and eats a well balanced diet. She has one acute compliant today- increase in frequency in hemorrhoids after long car trip a few weeks ago.  She denies hematochezia/hematuria, or abdominal pain.  She denies change in bowel habits. She reports that tinea capitus has completely cleared up and denies active area's of eczema.  Healthcare Maintenance: PAP- UTD Mammogram- Not indicated Immunizations- UTD  Patient Care Team    Relationship Specialty Notifications Start End  William Hamburger D, NP PCP - General Family Medicine  03/09/17   Waynard Reeds, MD Consulting Physician Obstetrics and Gynecology  03/09/17     Patient Active Problem List   Diagnosis Date Noted  . Vitamin D deficiency 09/07/2017  . Hemorrhoids 09/07/2017  . Eczema 03/09/2017  . Healthcare maintenance 03/09/2017  . Postpartum care following vaginal delivery 08/24/2014  . Status post vacuum-assisted vaginal delivery 08/24/2014     Past Medical History:  Diagnosis Date  . Asthma   . Complication of anesthesia    one side take of epidural  . Hx of varicella   . Pyelonephritis 2011   not during pregnancy     Past Surgical History:  Procedure Laterality Date  . EYE SURGERY  2008  . NO PAST SURGERIES       Family History  Problem Relation Age of Onset  . Healthy Mother   . Healthy Father   . Healthy  Sister   . Healthy Daughter   . Healthy Son   . COPD Paternal Grandmother        BREAST  . Healthy Son      Social History   Substance and Sexual Activity  Drug Use No     Social History   Substance and Sexual Activity  Alcohol Use Yes  . Alcohol/week: 1.8 oz  . Types: 3 Glasses of wine per week     Social History   Tobacco Use  Smoking Status Never Smoker  Smokeless Tobacco Never Used     Outpatient Encounter Medications as of 09/07/2017  Medication Sig  . desonide (DESOWEN) 0.05 % lotion Apply topically 2 (two) times daily.  . hydrocortisone cream 0.5 % Apply 1 application topically daily as needed for itching (rash).  Marland Kitchen ibuprofen (ADVIL,MOTRIN) 600 MG tablet Take 1 tablet (600 mg total) by mouth every 6 (six) hours as needed.  Marland Kitchen ketoconazole (NIZORAL) 2 % shampoo Apply 1 application topically 2 (two) times a week.  . norgestrel-ethinyl estradiol (LO/OVRAL,CRYSELLE) 0.3-30 MG-MCG tablet Take 1 tablet by mouth daily.  . Vitamin D, Ergocalciferol, (DRISDOL) 50000 units CAPS capsule TAKE 1 CAPSULE (50,000 UNITS TOTAL) BY MOUTH EVERY 7 (SEVEN) DAYS.  . [DISCONTINUED] desonide (DESOWEN) 0.05 % lotion Apply topically 2 (two) times daily.   No facility-administered encounter medications on file as of 09/07/2017.  Allergies: Patient has no known allergies.  Body mass index is 22.36 kg/m.  Blood pressure 113/76, pulse 73, height 5' 7.99" (1.727 m), weight 147 lb (66.7 kg), SpO2 100 %.  Review of Systems  Constitutional: Positive for fatigue. Negative for activity change, appetite change, chills, diaphoresis, fever and unexpected weight change.  HENT: Negative for congestion.   Eyes: Negative for visual disturbance.  Respiratory: Negative for cough, chest tightness, shortness of breath, wheezing and stridor.   Cardiovascular: Negative for chest pain, palpitations and leg swelling.  Gastrointestinal: Negative for abdominal distention, abdominal pain, anal bleeding,  blood in stool, constipation, diarrhea, nausea, rectal pain and vomiting.  Endocrine: Negative for cold intolerance, heat intolerance, polydipsia, polyphagia and polyuria.  Genitourinary: Negative for difficulty urinating, flank pain and hematuria.  Musculoskeletal: Negative for arthralgias, back pain, gait problem, joint swelling, myalgias, neck pain and neck stiffness.  Skin: Negative for color change, pallor, rash and wound.  Neurological: Negative for dizziness and headaches.  Hematological: Does not bruise/bleed easily.  Psychiatric/Behavioral: Negative for dysphoric mood, self-injury, sleep disturbance and suicidal ideas. The patient is not nervous/anxious and is not hyperactive.        Objective:   Physical Exam  Constitutional: She is oriented to person, place, and time. She appears well-developed and well-nourished. No distress.  HENT:  Head: Normocephalic and atraumatic.  Right Ear: External ear normal. Tympanic membrane is not perforated and not bulging. No decreased hearing is noted.  Left Ear: External ear normal. Tympanic membrane is not perforated and not bulging. No decreased hearing is noted.  Nose: No mucosal edema or rhinorrhea. Right sinus exhibits no maxillary sinus tenderness and no frontal sinus tenderness. Left sinus exhibits no maxillary sinus tenderness and no frontal sinus tenderness.  Mouth/Throat: Uvula is midline, oropharynx is clear and moist and mucous membranes are normal.  Eyes: Pupils are equal, round, and reactive to light. Conjunctivae are normal.  Neck: Normal range of motion. Neck supple.  Cardiovascular: Normal rate, regular rhythm, normal heart sounds and intact distal pulses.  No murmur heard. Pulmonary/Chest: Effort normal and breath sounds normal. No respiratory distress. She has no wheezes. She has no rales. She exhibits no tenderness.  Abdominal: Soft. Bowel sounds are normal. She exhibits no distension and no mass. There is no tenderness. There  is no rebound and no guarding.  Musculoskeletal: She exhibits no edema or tenderness.  Lymphadenopathy:    She has no cervical adenopathy.  Neurological: She is alert and oriented to person, place, and time. Coordination normal.  Skin: Skin is warm and dry. No rash noted. She is not diaphoretic. No erythema. No pallor.  Psychiatric: She has a normal mood and affect. Her behavior is normal. Judgment and thought content normal.  Nursing note and vitals reviewed.     Assessment & Plan:   1. Healthcare maintenance   2. Vitamin D deficiency   3. Eczema, unspecified type   4. Hemorrhoids, unspecified hemorrhoid type     Healthcare maintenance We will call you when Vit d level results are available. Continue to drink plenty of water and follow heart healthy diet. If hemorrhoids worsen or you notice blood in your stool- please call clinic and we will refer you to gastroenterologist. Recommend annual physical with fasting labs.  Eczema No active flare- ups Refilled Desowen 0.05% lotion  Hemorrhoids Stable If worsens or hematochezia noted- please call clinic and we will refer to GI Recommend to remain well hydrated and eat a diet rich in fruits/vegetables  FOLLOW-UP:  Return in about 1 year (around 09/08/2018) for CPE, Fasting Labs.

## 2017-09-07 NOTE — Assessment & Plan Note (Signed)
Stable If worsens or hematochezia noted- please call clinic and we will refer to GI Recommend to remain well hydrated and eat a diet rich in fruits/vegetables

## 2017-09-07 NOTE — Assessment & Plan Note (Signed)
We will call you when Vit d level results are available. Continue to drink plenty of water and follow heart healthy diet. If hemorrhoids worsen or you notice blood in your stool- please call clinic and we will refer you to gastroenterologist. Recommend annual physical with fasting labs.

## 2017-09-07 NOTE — Assessment & Plan Note (Signed)
No active flare- ups Refilled Desowen 0.05% lotion

## 2017-09-08 ENCOUNTER — Other Ambulatory Visit: Payer: Self-pay | Admitting: Adult Health

## 2017-09-08 LAB — VITAMIN D 25 HYDROXY (VIT D DEFICIENCY, FRACTURES): VIT D 25 HYDROXY: 41 ng/mL (ref 30.0–100.0)

## 2019-04-17 ENCOUNTER — Telehealth: Payer: Self-pay | Admitting: Adult Health

## 2019-04-17 NOTE — Telephone Encounter (Signed)
Front desk rep from [employer] @ [employer] called states someone turned in pt's Ins Card to them--they did not have her listed as a member & had no way to contact her but Ins Card had our Office name as PCP so they called [place]. ---Called pt left her voicemail re:  Found Ins Crd.  --glh

## 2020-03-26 ENCOUNTER — Ambulatory Visit
Admission: EM | Admit: 2020-03-26 | Discharge: 2020-03-26 | Disposition: A | Discharge status: Home / Self Care | Payer: BC Managed Care – PPO | Attending: Emergency Medicine | Admitting: Emergency Medicine

## 2020-03-26 DIAGNOSIS — Z20822 Contact with and (suspected) exposure to covid-19: Secondary | ICD-10-CM | POA: Insufficient documentation

## 2020-03-26 DIAGNOSIS — J36 Peritonsillar abscess: Secondary | ICD-10-CM

## 2020-03-26 LAB — POCT RAPID STREP A (OFFICE): Rapid Strep A Screen: NEGATIVE

## 2020-03-26 MED ORDER — AMOXICILLIN 500 MG PO CAPS: 500.0000 mg | ORAL_CAPSULE | Freq: Two times a day (BID) | ORAL | 0 refills | Status: AC

## 2020-03-26 NOTE — Discharge Instructions (Addendum)
Strep test negative, Covid test pending Begin amoxicillin twice daily for 10 days to cover tonsillitis. Ibuprofen or Tylenol for pain Honey and hot tea Rest and fluids Follow-up if not improving or worsening

## 2020-03-26 NOTE — ED Provider Notes (Signed)
[facility]    CSN: [id number] Arrival date & time: 03/26/20  0949      History   Chief Complaint Chief Complaint  Patient presents with  . Sore Throat    HPI Miranda Davis is a 40 y.o. female history of asthma presenting today for evaluation of URI symptoms.  Reports that she has had sore throat cough and ear pressure over the weekend, but this has improved.  Main complaint today is continued sore throat.  Reports notable area with a white patch.  Denies any fever chills or body aches.  HPI  Past Medical History:  Diagnosis Date  . Asthma   . Complication of anesthesia    one side take of epidural  . Hx of varicella   . Pyelonephritis 2011   not during pregnancy    Patient Active Problem List   Diagnosis Date Noted  . Vitamin D deficiency 09/07/2017  . Hemorrhoids 09/07/2017  . Eczema 03/09/2017  . Healthcare maintenance 03/09/2017  . Postpartum care following vaginal delivery 08/24/2014  . Status post vacuum-assisted vaginal delivery 08/24/2014    Past Surgical History:  Procedure Laterality Date  . EYE SURGERY  2008  . NO PAST SURGERIES      OB History    Gravida  3   Para  3   Term  3   Preterm  0   AB  0   Living  3     SAB  0   IAB  0   Ectopic  0   Multiple  0   Live Births  3            Home Medications    Prior to Admission medications   Medication Sig Start Date End Date Taking? Authorizing Provider  amoxicillin (AMOXIL) 500 MG capsule Take 1 capsule (500 mg total) by mouth 2 (two) times daily for 10 days. 03/26/20 04/05/20 Yes Perrie Ragin C, PA-C  desonide (DESOWEN) 0.05 % lotion Apply topically 2 (two) times daily. 09/07/17   Danford, Valetta Fuller D, NP  hydrocortisone cream 0.5 % Apply 1 application topically daily as needed for itching (rash).    [provider]  ibuprofen (ADVIL,MOTRIN) 600 MG tablet Take 1 tablet (600 mg total) by mouth every 6 (six) hours as needed. 08/26/14   Sanjuana Kava, MD   ketoconazole (NIZORAL) 2 % shampoo Apply 1 application topically 2 (two) times a week. 03/11/17   Danford, Valetta Fuller D, NP  norgestrel-ethinyl estradiol (LO/OVRAL,CRYSELLE) 0.3-30 MG-MCG tablet Take 1 tablet by mouth daily.    [provider]    Family History Family History  Problem Relation Age of Onset  . Healthy Mother   . Healthy Father   . Healthy Sister   . Healthy Daughter   . Healthy Son   . COPD Paternal Grandmother        BREAST  . Healthy Son     Social History Social History   Tobacco Use  . Smoking status: Never Smoker  . Smokeless tobacco: Never Used  Vaping Use  . Vaping Use: Never used  Substance Use Topics  . Alcohol use: Yes    Alcohol/week: 3.0 standard drinks    Types: 3 Glasses of wine per week  . Drug use: No     Allergies   Patient has no known allergies.   Review of Systems Review of Systems  Constitutional: Negative for activity change, appetite change, chills, fatigue and fever.  HENT: Positive for sore throat. Negative  for congestion, ear pain, rhinorrhea, sinus pressure and trouble swallowing.   Eyes: Negative for discharge and redness.  Respiratory: Negative for cough, chest tightness and shortness of breath.   Cardiovascular: Negative for chest pain.  Gastrointestinal: Negative for abdominal pain, diarrhea, nausea and vomiting.  Musculoskeletal: Negative for myalgias.  Skin: Negative for rash.  Neurological: Negative for dizziness, light-headedness and headaches.     Physical Exam Triage Vital Signs ED Triage Vitals [03/26/20 1149]  Enc Vitals Group     BP (!) 153/106     Pulse Rate (!) 104     Resp 18     Temp 98.7 F (37.1 C)     Temp Source Oral     SpO2 98 %     Weight      Height      Head Circumference      Peak Flow      Pain Score 5     Pain Loc      Pain Edu?      Excl. in GC?    No data found.  Updated Vital Signs BP (!) 153/106 (BP Location: Left Arm)   Pulse (!) 104   Temp 98.7 F (37.1 C)  (Oral)   Resp 18   LMP 03/05/2020   SpO2 98%   Visual Acuity Right Eye Distance:   Left Eye Distance:   Bilateral Distance:    Right Eye Near:   Left Eye Near:    Bilateral Near:     Physical Exam Vitals and nursing note reviewed.  Constitutional:      Appearance: She is well-developed and well-nourished.     Comments: No acute distress  HENT:     Head: Normocephalic and atraumatic.     Ears:     Comments: Bilateral ears without tenderness to palpation of external auricle, tragus and mastoid, EAC's without erythema or swelling, TM's with good bony landmarks and cone of light. Non erythematous.     Nose: Nose normal.     Mouth/Throat:     Comments: Oral mucosa pink and moist, no tonsillar enlargement or exudate, area just posterior to soft palate extending towards uvula with erythema and large white exudate, posterior pharynx patent and nonerythematous, no uvula deviation or swelling. Normal phonation. Eyes:     Conjunctiva/sclera: Conjunctivae normal.  Cardiovascular:     Rate and Rhythm: Normal rate.  Pulmonary:     Effort: Pulmonary effort is normal. No respiratory distress.     Comments: Breathing comfortably at rest, CTABL, no wheezing, rales or other adventitious sounds auscultated Abdominal:     General: There is no distension.  Musculoskeletal:        General: Normal range of motion.     Cervical back: Neck supple.  Skin:    General: Skin is warm and dry.  Neurological:     Mental Status: She is alert and oriented to person, place, and time.  Psychiatric:        Mood and Affect: Mood and affect normal.      UC Treatments / Results  Labs (all labs ordered are listed, but only abnormal results are displayed) Labs Reviewed  NOVEL CORONAVIRUS, NAA  CULTURE, GROUP A STREP [facility])  POCT RAPID STREP A (OFFICE)    EKG   Radiology No results found.  Procedures Procedures (including critical care time)  Medications Ordered in UC Medications - No data to  display  Initial Impression / Assessment and Plan / UC Course  I have reviewed  the triage vital signs and the nursing notes.  Pertinent labs & imaging results that were available during my care of the patient were reviewed by me and considered in my medical decision making (see chart for details).     Strep negative, Covid pending, given appearance of throat opted to go ahead and place on amoxicillin and treat for peritonsillitis.  Continue symptomatic and supportive care as well.  Discussed strict return precautions. Patient verbalized understanding and is agreeable with plan.  Final Clinical Impressions(s) / UC Diagnoses   Final diagnoses:  Encounter for screening laboratory testing for COVID-19 virus  Peritonsillitis     Discharge Instructions     Strep test negative, Covid test pending Begin amoxicillin twice daily for 10 days to cover tonsillitis. Ibuprofen or Tylenol for pain Honey and hot tea Rest and fluids Follow-up if not improving or worsening    ED Prescriptions    Medication Sig Dispense Auth. Provider   amoxicillin (AMOXIL) 500 MG capsule Take 1 capsule (500 mg total) by mouth 2 (two) times daily for 10 days. 20 capsule Meital Riehl, [place] C, PA-C     PDMP not reviewed this encounter.   Lew Dawes, [place] 03/26/20 1257

## 2020-03-26 NOTE — ED Triage Notes (Signed)
Pt c/o sore throat, cough, and ear pressure over the weekend but subsided. States now throat hurt to swallow and has white patches to throat.

## 2020-03-27 LAB — SARS-COV-2, NAA 2 DAY TAT

## 2020-03-27 LAB — NOVEL CORONAVIRUS, NAA: SARS-CoV-2, NAA: NOT DETECTED

## 2020-03-29 LAB — CULTURE, GROUP A STREP (THRC)

## 2020-04-23 ENCOUNTER — Ambulatory Visit: Payer: Self-pay | Admitting: Physician Assistant

## 2020-06-12 ENCOUNTER — Encounter: Payer: Self-pay | Admitting: Nurse Practitioner

## 2020-06-12 ENCOUNTER — Other Ambulatory Visit: Payer: Self-pay

## 2020-06-12 ENCOUNTER — Ambulatory Visit (INDEPENDENT_AMBULATORY_CARE_PROVIDER_SITE_OTHER): Payer: BC Managed Care – PPO | Admitting: Nurse Practitioner

## 2020-06-12 VITALS — BP 99/69 | HR 94 | Temp 99.2°F | Ht 68.0 in | Wt 143.8 lb

## 2020-06-12 DIAGNOSIS — B372 Candidiasis of skin and nail: Secondary | ICD-10-CM | POA: Insufficient documentation

## 2020-06-12 DIAGNOSIS — Z7689 Persons encountering health services in other specified circumstances: Secondary | ICD-10-CM | POA: Diagnosis not present

## 2020-06-12 DIAGNOSIS — E559 Vitamin D deficiency, unspecified: Secondary | ICD-10-CM

## 2020-06-12 DIAGNOSIS — Z0000 Encounter for general adult medical examination without abnormal findings: Secondary | ICD-10-CM

## 2020-06-12 DIAGNOSIS — R5383 Other fatigue: Secondary | ICD-10-CM | POA: Diagnosis not present

## 2020-06-12 NOTE — Progress Notes (Signed)
New Patient Office Visit  Subjective:  Patient ID: Miranda Davis, female    DOB: [birth year]  Age: 40 y.o. MRN: [id number]  CC:  Chief Complaint  Patient presents with  . New Patient (Initial Visit)    HPI Miranda Davis presents to establish new primary care provider. She states that she gets small red rashes in "hot spots" on her body. These areas are upper thighs, upper arms, and under the arms. She does use OTC hydrocortisone cream when these areas begin to bother her and this usually helps. In the hot summer months, she will get more of these areas of rash. She has seen a dermatologist for this. They have prescribed ketoconazole cream to go along with hydrocortisone if hydrocortisone alone does not help.  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 She states that she keeps long hours at work. She also has three children of her own. They are 68, 33, and 38 years old. She frequently feels fatigued. Busy life and stress contribute to the fatigue. She has not had labs checked in some time.  She sees GYN provider every year. She takes oral contraceptive. Menstrual cycles are regular with her last period starting 3/8/20222.  She has no other concerns or complaints today.   Past Medical History:  Diagnosis Date  . Asthma   . Complication of anesthesia    one side take of epidural  . Hx of varicella   . Pyelonephritis 2011   not during pregnancy    Past Surgical History:  Procedure Laterality Date  . EYE SURGERY  2008  . NO PAST SURGERIES      Family History  Problem Relation Age of Onset  . Healthy Mother   . Healthy Father   . Healthy Sister   . Healthy Daughter   . Healthy Son   . COPD Paternal Grandmother        BREAST  . Healthy Son     Social History   Socioeconomic History  . Marital status: Married    Spouse name: Not on file  . Number of children: Not on file  . Years of education: Not on file  . Highest education level: Not on file   Occupational History  . Not on file  Tobacco Use  . Smoking status: Never Smoker  . Smokeless tobacco: Never Used  Vaping Use  . Vaping Use: Never used  Substance and Sexual Activity  . Alcohol use: Yes    Alcohol/week: 3.0 standard drinks    Types: 3 Glasses of wine per week  . Drug use: No  . Sexual activity: Yes    Birth control/protection: Pill  Other Topics Concern  . Not on file  Social History Narrative  . Not on file   Social Determinants of Health   Financial Resource Strain: Not on file  Food Insecurity: Not on file  Transportation Needs: Not on file  Physical Activity: Not on file  Stress: Not on file  Social Connections: Not on file  Intimate Partner Violence: Not on file    ROS Review of Systems  Constitutional: Positive for fatigue. Negative for chills and fever.  HENT: Negative for congestion and sinus pain.   Respiratory: Negative for cough and wheezing.   Cardiovascular: Negative for chest pain and palpitations.  Gastrointestinal: Negative for constipation.  Endocrine: Negative.   Genitourinary: Negative.   Musculoskeletal: Negative for back pain and myalgias.  Skin: Positive for rash.  Small patchy areas of rash on upper thighs, upper arms, and under her arms.   Allergic/Immunologic: Negative.   Neurological: Negative.  Negative for dizziness, weakness and headaches.  Hematological: Negative.   Psychiatric/Behavioral: The patient is not nervous/anxious.   All other systems reviewed and are negative.   Objective:   Today's Vitals   06/12/20 0828  BP: 99/69  Pulse: 94  Temp: 99.2 F (37.3 C)  SpO2: 100%  Weight: 143 lb 12.8 oz (65.2 kg)  Height: 5\' 8"  (1.727 m)   Body mass index is 21.86 kg/m.   Physical Exam Vitals and nursing note reviewed.  Constitutional:      Appearance: Normal appearance. She is well-developed.  HENT:     Head: Normocephalic and atraumatic.     Nose: Nose normal.  Eyes:     Conjunctiva/sclera:  Conjunctivae normal.     Pupils: Pupils are equal, round, and reactive to light.  Cardiovascular:     Rate and Rhythm: Normal rate and regular rhythm.     Pulses: Normal pulses.     Heart sounds: Normal heart sounds.  Pulmonary:     Effort: Pulmonary effort is normal.     Breath sounds: Normal breath sounds.  Abdominal:     Palpations: Abdomen is soft.  Musculoskeletal:        General: Normal range of motion.     Cervical back: Normal range of motion and neck supple.  Skin:    General: Skin is warm and dry.  Neurological:     General: No focal deficit present.     Mental Status: She is alert and oriented to person, place, and time.  Psychiatric:        Mood and Affect: Mood normal.        Behavior: Behavior normal.        Thought Content: Thought content normal.        Judgment: Judgment normal.     Assessment & Plan:  1. Encounter to establish care Appointment today to establish new primary care provider.   2. Fatigue, unspecified type Will check fasting blood work. Add B12 and folate, Free T4, and vitamin d levels.  - B12 and Folate Panel - VITAMIN D 25 Hydroxy (Vit-D Deficiency, Fractures) - T4, free - CBC - Comprehensive metabolic panel - TSH - Lipid panel - Hemoglobin A1c  3. Vitamin D deficiency Add vitamin d to fasting blood work for further evaluation  - B12 and Folate Panel - VITAMIN D 25 Hydroxy (Vit-D Deficiency, Fractures) - T4, free - CBC - Comprehensive metabolic panel - TSH - Lipid panel - Hemoglobin A1c  4. Cutaneous candidiasis Advised she try using OTC clotrimazole twice daily along with hydrocortisone cream. May need to use for 1 to 2 weeks at a time. Repeat cycle as needed. Follow up with dermatology as scheduled.   5. Healthcare maintenance Check routine, fasting blood work today.  - B12 and Folate Panel - VITAMIN D 25 Hydroxy (Vit-D Deficiency, Fractures) - T4, free - CBC - Comprehensive metabolic panel - TSH - Lipid panel -  Hemoglobin A1c   Problem List Items Addressed This Visit      Musculoskeletal and Integument   Cutaneous candidiasis     Other   Healthcare maintenance   Relevant Orders   B12 and Folate Panel   VITAMIN D 25 Hydroxy (Vit-D Deficiency, Fractures)   T4, free   CBC   Comprehensive metabolic panel   TSH   Lipid panel   Hemoglobin  A1c   Vitamin D deficiency   Relevant Orders   B12 and Folate Panel   VITAMIN D 25 Hydroxy (Vit-D Deficiency, Fractures)   T4, free   CBC   Comprehensive metabolic panel   TSH   Lipid panel   Hemoglobin A1c   Encounter to establish care - Primary   Fatigue   Relevant Orders   B12 and Folate Panel   VITAMIN D 25 Hydroxy (Vit-D Deficiency, Fractures)   T4, free   CBC   Comprehensive metabolic panel   TSH   Lipid panel   Hemoglobin A1c      Outpatient Encounter Medications as of 06/12/2020  Medication Sig  . hydrocortisone cream 0.5 % Apply 1 application topically daily as needed for itching (rash).  Marland Kitchen ibuprofen (ADVIL,MOTRIN) 600 MG tablet Take 1 tablet (600 mg total) by mouth every 6 (six) hours as needed.  . norgestrel-ethinyl estradiol (LO/OVRAL,CRYSELLE) 0.3-30 MG-MCG tablet Take 1 tablet by mouth daily.  Marland Kitchen desonide (DESOWEN) 0.05 % lotion Apply topically 2 (two) times daily. (Patient not taking: Reported on 06/12/2020)  . ketoconazole (NIZORAL) 2 % shampoo Apply 1 application topically 2 (two) times a week. (Patient not taking: Reported on 06/12/2020)   No facility-administered encounter medications on file as of 06/12/2020.   Time spent with the patient was approximately 45 minutes. This time included reviewing progress notes, labs, imaging studies, and discussing plan for follow up.   Follow-up: Return in about 6 months (around 12/13/2020) for CPE .   Carlean Jews, NP

## 2020-06-12 NOTE — Patient Instructions (Signed)
Managing Stress, Adult Feeling a certain amount of stress is normal. Stress helps our body and mind get ready to deal with the demands of life. Stress hormones can motivate you to do well at work and meet your responsibilities. However severe or long-lasting (chronic) stress can affect your mental and physical health. Chronic stress puts you at higher risk for anxiety, depression, and other health problems like digestive problems, muscle aches, heart disease, high blood pressure, and stroke. What are the causes? Common causes of stress include:  Demands from work, such as deadlines, feeling overworked, or having long hours.  Pressures at home, such as money issues, disagreements with a spouse, or parenting issues.  Pressures from major life changes, such as divorce, moving, loss of a loved one, or chronic illness. You may be at higher risk for stress-related problems if you do not get enough sleep, are in poor health, do not have emotional support, or have a mental health disorder like anxiety or depression. How to recognize stress Stress can make you:  Have trouble sleeping.  Feel sad, anxious, irritable, or overwhelmed.  Lose your appetite.  Overeat or want to eat unhealthy foods.  Want to use drugs or alcohol. Stress can also cause physical symptoms, such as:  Sore, tense muscles, especially in the shoulders and neck.  Headaches.  Trouble breathing.  A faster heart rate.  Stomach pain, nausea, or vomiting.  Diarrhea or constipation.  Trouble concentrating. Follow these instructions at home: Lifestyle  Identify the source of your stress and your reaction to it. See a therapist who can help you change your reactions.  When there are stressful events: ? Talk about it with family, friends, or co-workers. ? Try to think realistically about stressful events and not ignore them or overreact. ? Try to find the positives in a stressful situation and not focus on the  negatives. ? Cut back on responsibilities at work and home, if possible. Ask for help from friends or family members if you need it.  Find ways to cope with stress, such as: ? Meditation. ? Deep breathing. ? Yoga or tai chi. ? Progressive muscle relaxation. ? Doing art, playing music, or reading. ? Making time for fun activities. ? Spending time with family and friends.  Get support from family, friends, or spiritual resources. Eating and drinking  Eat a healthy diet. This includes: ? Eating foods that are high in fiber, such as beans, whole grains, and fresh fruits and vegetables. ? Limiting foods that are high in fat and processed sugars, such as fried and sweet foods.  Do not skip meals or overeat.  Drink enough fluid to keep your urine pale yellow. Alcohol use  Do not drink alcohol if: ? Your health care provider tells you not to drink. ? You are pregnant, may be pregnant, or are planning to become pregnant.  Drinking alcohol is a way some people try to ease their stress. This can be dangerous, so if you drink alcohol: ? Limit how much you use to:  0-1 drink a day for women.  0-2 drinks a day for men. ? Be aware of how much alcohol is in your drink. In the [place] one drink equals one 12 oz bottle of beer (355 mL), one 5 oz glass of wine (148 mL), or one 1 oz glass of hard liquor (44 mL). Activity  Include 30 minutes of exercise in your daily schedule. Exercise is a good stress reducer.  Include time in your day for   an activity that you find relaxing. Try taking a walk, going on a bike ride, reading a book, or listening to music.  Schedule your time in a way that lowers stress, and keep a consistent schedule. Prioritize what is most important to get done.   General instructions  Get enough sleep. Try to go to sleep and get up at about the same time every day.  Take over-the-counter and prescription medicines only as told by your health care provider.  Do not use any  products that contain nicotine or tobacco, such as cigarettes, e-cigarettes, and chewing tobacco. If you need help quitting, ask your health care provider.  Do not use drugs or smoke to cope with stress.  Keep all follow-up visits as told by your health care provider. This is important. Where to find support  Talk with your health care provider about stress management or finding a support group.  Find a therapist to work with you on your stress management techniques. Contact a health care provider if:  Your stress symptoms get worse.  You are unable to manage your stress at home.  You are struggling to stop using drugs or alcohol. Get help right away if:  You may be a danger to yourself or others.  You have any thoughts of death or suicide. If you ever feel like you may hurt yourself or others, or have thoughts about taking your own life, get help right away. You can go to your nearest emergency department or call:  Your local emergency services (911 in the [place]  A suicide crisis helpline, such as the [place] at (315) 014-9930. This is open 24 hours a day. Summary  Feeling a certain amount of stress is normal, but severe or long-lasting (chronic) stress can affect your mental and physical health.  Chronic stress can put you at higher risk for anxiety, depression, and other health problems like digestive problems, muscle aches, heart disease, high blood pressure, and stroke.  You may be at higher risk for stress-related problems if you do not get enough sleep, are in poor health, lack emotional support, or have a mental health disorder like anxiety or depression.  Identify the source of your stress and your reaction to it. Try talking about stressful events with family, friends, or co-workers, finding a coping method, or getting support from spiritual resources.  If you need more help, talk with your health care provider about finding a support group  or a mental health therapist. This information is not intended to replace advice given to you by your health care provider. Make sure you discuss any questions you have with your health care provider. Document Revised: 10/05/2018 Document Reviewed: 10/05/2018 Elsevier Patient Education  [place]. Fatigue If you have fatigue, you feel tired all the time and have a lack of energy or a lack of motivation. Fatigue may make it difficult to start or complete tasks because of exhaustion. In general, occasional or mild fatigue is often a normal response to activity or life. However, long-lasting (chronic) or extreme fatigue may be a symptom of a medical condition. Follow these instructions at home: General instructions  Watch your fatigue for any changes.  Go to bed and get up at the same time every day.  Avoid fatigue by pacing yourself during the day and getting enough sleep at night.  Maintain a healthy weight. Medicines  Take over-the-counter and prescription medicines only as told by your health care provider.  Take a multivitamin,  if told by your health care provider.  Do not use herbal or dietary supplements unless they are approved by your health care provider. Activity  Exercise regularly, as told by your health care provider.  Use or practice techniques to help you relax, such as yoga, tai chi, meditation, or massage therapy.   Eating and drinking  Avoid heavy meals in the evening.  Eat a well-balanced diet, which includes lean proteins, whole grains, plenty of fruits and vegetables, and low-fat dairy products.  Avoid consuming too much caffeine.  Avoid the use of alcohol.  Drink enough fluid to keep your urine pale yellow.   Lifestyle  Change situations that cause you stress. Try to keep your work and personal schedule in balance.  Do not use any products that contain nicotine or tobacco, such as cigarettes and e-cigarettes. If you need help quitting, ask your  health care provider.  Do not use drugs. Contact a health care provider if:  Your fatigue does not get better.  You have a fever.  You suddenly lose or gain weight.  You have headaches.  You have trouble falling asleep or sleeping through the night.  You feel angry, guilty, anxious, or sad.  You are unable to have a bowel movement (constipation).  Your skin is dry.  You have swelling in your legs or another part of your body. Get help right away if:  You feel confused.  Your vision is blurry.  You feel faint or you pass out.  You have a severe headache.  You have severe pain in your abdomen, your back, or the area between your waist and hips (pelvis).  You have chest pain, shortness of breath, or an irregular or fast heartbeat.  You are unable to urinate, or you urinate less than normal.  You have abnormal bleeding, such as bleeding from the rectum, vagina, nose, lungs, or nipples.  You vomit blood.  You have thoughts about hurting yourself or others. If you ever feel like you may hurt yourself or others, or have thoughts about taking your own life, get help right away. You can go to your nearest emergency department or call:  Your local emergency services (911 in the [place]  A suicide crisis helpline, such as the [place] at 804-283-7627. This is open 24 hours a day. Summary  If you have fatigue, you feel tired all the time and have a lack of energy or a lack of motivation.  Fatigue may make it difficult to start or complete tasks because of exhaustion.  Long-lasting (chronic) or extreme fatigue may be a symptom of a medical condition.  Exercise regularly, as told by your health care provider.  Change situations that cause you stress. Try to keep your work and personal schedule in balance. This information is not intended to replace advice given to you by your health care provider. Make sure you discuss any questions you have  with your health care provider. Document Revised: 09/28/2018 Document Reviewed: 12/02/2016 Elsevier Patient Education  2021 [employer].

## 2020-06-13 LAB — VITAMIN D 25 HYDROXY (VIT D DEFICIENCY, FRACTURES): Vit D, 25-Hydroxy: 36.4 ng/mL (ref 30.0–100.0)

## 2020-06-13 LAB — COMPREHENSIVE METABOLIC PANEL
ALT: 16 IU/L (ref 0–32)
AST: 25 IU/L (ref 0–40)
Albumin/Globulin Ratio: 1.4 (ref 1.2–2.2)
Albumin: 4.1 g/dL (ref 3.8–4.8)
Alkaline Phosphatase: 64 IU/L (ref 44–121)
BUN/Creatinine Ratio: 11 (ref 9–23)
BUN: 8 mg/dL (ref 6–20)
Bilirubin Total: 0.5 mg/dL (ref 0.0–1.2)
CO2: 22 mmol/L (ref 20–29)
Calcium: 9.1 mg/dL (ref 8.7–10.2)
Chloride: 103 mmol/L (ref 96–106)
Creatinine, Ser: 0.74 mg/dL (ref 0.57–1.00)
Globulin, Total: 2.9 g/dL (ref 1.5–4.5)
Glucose: 55 mg/dL — ABNORMAL LOW (ref 65–99)
Potassium: 4.2 mmol/L (ref 3.5–5.2)
Sodium: 138 mmol/L (ref 134–144)
Total Protein: 7 g/dL (ref 6.0–8.5)
eGFR: 107 mL/min/{1.73_m2} (ref >59)

## 2020-06-13 LAB — LIPID PANEL
Chol/HDL Ratio: 2.9 ratio (ref 0.0–4.4)
Cholesterol, Total: 170 mg/dL (ref 100–199)
HDL: 59 mg/dL (ref >39)
LDL Chol Calc (NIH): 99 mg/dL (ref 0–99)
Triglycerides: 64 mg/dL (ref 0–149)
VLDL Cholesterol Cal: 12 mg/dL (ref 5–40)

## 2020-06-13 LAB — CBC
Hematocrit: 41.9 % (ref 34.0–46.6)
Hemoglobin: 14.4 g/dL (ref 11.1–15.9)
MCH: 31.9 pg (ref 26.6–33.0)
MCHC: 34.4 g/dL (ref 31.5–35.7)
MCV: 93 fL (ref 79–97)
Platelets: 268 10*3/uL (ref 150–450)
RBC: 4.51 x10E6/uL (ref 3.77–5.28)
RDW: 11.8 % (ref 11.7–15.4)
WBC: 6.1 10*3/uL (ref 3.4–10.8)

## 2020-06-13 LAB — B12 AND FOLATE PANEL
Folate: 10.2 ng/mL (ref >3.0)
Vitamin B-12: 480 pg/mL (ref 232–1245)

## 2020-06-13 LAB — TSH: TSH: 2.26 u[IU]/mL (ref 0.450–4.500)

## 2020-06-13 LAB — T4, FREE: Free T4: 0.99 ng/dL (ref 0.82–1.77)

## 2020-06-13 LAB — HEMOGLOBIN A1C
Est. average glucose Bld gHb Est-mCnc: 91 mg/dL
Hgb A1c MFr Bld: 4.8 % (ref 4.8–5.6)

## 2020-06-13 NOTE — Progress Notes (Signed)
Waiting on all results

## 2020-06-24 NOTE — Progress Notes (Signed)
Please let the patient know that overall, labs were good. Her blood sugar was just a little low. She should eat several small meals/snacks every day to keep this steady. She should consider meals and snacks high in protein as this helps to keep blood sugars more stable without crashes. Other labs good. Thanks.

## 2021-04-21 ENCOUNTER — Other Ambulatory Visit: Payer: Self-pay

## 2021-04-21 ENCOUNTER — Ambulatory Visit: Payer: BC Managed Care – PPO | Admitting: Nurse Practitioner

## 2021-04-21 ENCOUNTER — Encounter: Payer: Self-pay | Admitting: Nurse Practitioner

## 2021-04-21 VITALS — BP 101/71 | HR 82 | Temp 98.0°F | Ht 68.0 in | Wt 157.5 lb

## 2021-04-21 DIAGNOSIS — H109 Unspecified conjunctivitis: Secondary | ICD-10-CM | POA: Insufficient documentation

## 2021-04-21 MED ORDER — POLYMYXIN B-TRIMETHOPRIM 10000-0.1 UNIT/ML-% OP SOLN: OPHTHALMIC | 0 refills | Status: DC

## 2021-04-21 NOTE — Progress Notes (Signed)
Established patient visit   Patient: Miranda Davis   DOB: 1980/10/21   41 y.o. Female  MRN: [id number] Visit Date: 04/21/2021  Chief Complaint  Patient presents with   Conjunctivitis   Subjective    The patient states that she woke up this morning with left eye crusted shot with drainage. She has some mild sinus congestion and some mild hoarseness. Symptoms started suddenly today.   Conjunctivitis  The current episode started today. The onset was sudden. The problem has been unchanged. The problem is moderate. The symptoms are relieved by one or more OTC medications. Associated symptoms include eye itching, photophobia, congestion, headaches, rhinorrhea, eye discharge and eye redness. Pertinent negatives include no fever, no decreased vision, no double vision, no abdominal pain, no constipation, no diarrhea, no nausea, no vomiting, no sore throat, no cough, no wheezing and no rash. The eye pain is mild. The left eye is affected. The eye pain is not associated with movement. The eyelid exhibits swelling and redness.     Medications: Outpatient Medications Prior to Visit  Medication Sig   hydrocortisone cream 0.5 % Apply 1 application topically daily as needed for itching (rash).   ibuprofen (ADVIL,MOTRIN) 600 MG tablet Take 1 tablet (600 mg total) by mouth every 6 (six) hours as needed.   norgestrel-ethinyl estradiol (LO/OVRAL,CRYSELLE) 0.3-30 MG-MCG tablet Take 1 tablet by mouth daily.   No facility-administered medications prior to visit.    Review of Systems  Constitutional:  Negative for activity change, appetite change, chills, fatigue and fever.  HENT:  Positive for congestion, rhinorrhea and sinus pressure. Negative for postnasal drip, sinus pain, sneezing and sore throat.   Eyes:  Positive for photophobia, discharge, redness and itching. Negative for double vision.  Respiratory:  Negative for cough, chest tightness, shortness of breath and wheezing.   Cardiovascular:   Negative for chest pain and palpitations.  Gastrointestinal:  Negative for abdominal pain, constipation, diarrhea, nausea and vomiting.  Endocrine: Negative for cold intolerance, heat intolerance, polydipsia and polyuria.  Genitourinary:  Negative for dyspareunia, dysuria, flank pain, frequency and urgency.  Musculoskeletal:  Negative for arthralgias, back pain and myalgias.  Skin:  Negative for rash.  Allergic/Immunologic: Negative for environmental allergies.  Neurological:  Positive for headaches. Negative for dizziness and weakness.  Hematological:  Negative for adenopathy.  Psychiatric/Behavioral:  The patient is not nervous/anxious.     Objective     Today's Vitals   04/21/21 1313  BP: 101/71  Pulse: 82  Temp: 98 F (36.7 C)  SpO2: 99%  Weight: 157 lb 8 oz (71.4 kg)  Height: 5\' 8"  (1.727 m)   Body mass index is 23.95 kg/m.   Physical Exam Vitals and nursing note reviewed.  Constitutional:      Appearance: Normal appearance. She is well-developed.  HENT:     Head: Normocephalic and atraumatic.     Nose: Nose normal.     Mouth/Throat:     Mouth: Mucous membranes are moist.     Pharynx: Oropharynx is clear.  Eyes:     General:        Left eye: Discharge present.    Extraocular Movements: Extraocular movements intact.     Conjunctiva/sclera:     Left eye: Left conjunctiva is injected. Exudate present.     Pupils: Pupils are equal, round, and reactive to light.     Comments: There is significant redness to the sclera of the left eye. Upper and lower eyelids are erythematous. Mild lid edema noted. Excess  tearing also noted.   Cardiovascular:     Rate and Rhythm: Normal rate and regular rhythm.     Pulses: Normal pulses.     Heart sounds: Normal heart sounds.  Pulmonary:     Effort: Pulmonary effort is normal.     Breath sounds: Normal breath sounds.  Abdominal:     Palpations: Abdomen is soft.  Musculoskeletal:        General: Normal range of motion.      Cervical back: Normal range of motion and neck supple.  Lymphadenopathy:     Cervical: No cervical adenopathy.  Skin:    General: Skin is warm and dry.     Capillary Refill: Capillary refill takes less than 2 seconds.  Neurological:     General: No focal deficit present.     Mental Status: She is alert and oriented to person, place, and time.  Psychiatric:        Mood and Affect: Mood normal.        Behavior: Behavior normal.        Thought Content: Thought content normal.        Judgment: Judgment normal.      Assessment & Plan     1. Bacterial conjunctivitis of left eye Start polytrim eye drops. Will treat both eyes. Use 1 drop in both eyes every 2 to 3 hours while awake for next 7 days. Advised her to keep hands away from her eyes. If she does touch the eyes, she should wash her eyes immediately. Apply warm and moist compress to the eye to improve irritation and redness.  - trimethoprim-polymyxin b (POLYTRIM) ophthalmic solution; Insert 1 drop into both eyes every 2 to 3 hours while awake for next 7 days.  Dispense: 10 mL; Refill: 0   Return for prn worsening or persistent symptoms.        Carlean Jews, NP  [facility] [phone] (phone) [phone] (fax)  [facility]

## 2021-10-01 LAB — HM PAP SMEAR: HPV, high-risk: NEGATIVE

## 2021-11-24 ENCOUNTER — Encounter: Payer: Self-pay | Admitting: Emergency Medicine

## 2021-11-24 ENCOUNTER — Ambulatory Visit
Admission: EM | Admit: 2021-11-24 | Discharge: 2021-11-24 | Disposition: A | Discharge status: Home / Self Care | Payer: BC Managed Care – PPO | Attending: Emergency Medicine | Admitting: Emergency Medicine

## 2021-11-24 ENCOUNTER — Emergency Department
Admission: EM | Admit: 2021-11-24 | Discharge: 2021-11-24 | Disposition: A | Discharge status: Home / Self Care | Payer: BC Managed Care – PPO | Attending: Emergency Medicine | Admitting: Emergency Medicine

## 2021-11-24 ENCOUNTER — Other Ambulatory Visit: Payer: Self-pay

## 2021-11-24 DIAGNOSIS — T782XXA Anaphylactic shock, unspecified, initial encounter: Secondary | ICD-10-CM | POA: Insufficient documentation

## 2021-11-24 DIAGNOSIS — T63441A Toxic effect of venom of bees, accidental (unintentional), initial encounter: Secondary | ICD-10-CM

## 2021-11-24 MED ORDER — FAMOTIDINE 40 MG PO TABS
40.0000 mg | ORAL_TABLET | Freq: Once | ORAL | Status: AC
  Administered 2021-11-24: 40 mg via ORAL

## 2021-11-24 MED ORDER — DIPHENHYDRAMINE HCL 25 MG PO CAPS
25.0000 mg | ORAL_CAPSULE | Freq: Once | ORAL | Status: AC
  Administered 2021-11-24: 25 mg via ORAL

## 2021-11-24 MED ORDER — DIPHENHYDRAMINE HCL 25 MG PO CAPS: 25.0000 mg | ORAL_CAPSULE | Freq: Three times a day (TID) | ORAL | 0 refills | Status: DC | PRN

## 2021-11-24 MED ORDER — METHYLPREDNISOLONE SODIUM SUCC 125 MG IJ SOLR: 125.0000 mg | Freq: Once | INTRAMUSCULAR | Status: DC

## 2021-11-24 MED ORDER — PREDNISONE 50 MG PO TABS: ORAL_TABLET | ORAL | 0 refills | Status: DC

## 2021-11-24 MED ORDER — EPINEPHRINE 0.3 MG/0.3ML IJ SOAJ: 0.3000 mg | INTRAMUSCULAR | 0 refills | Status: DC | PRN

## 2021-11-24 MED ORDER — METHYLPREDNISOLONE SODIUM SUCC 125 MG IJ SOLR
125.0000 mg | Freq: Once | INTRAMUSCULAR | Status: AC
  Administered 2021-11-24: 125 mg via INTRAMUSCULAR

## 2021-11-24 NOTE — ED Provider Notes (Signed)
[facility] Provider Note    Event Date/Time   First MD Initiated Contact with Patient 11/24/21 2021     (approximate)   History   Allergic Reaction   HPI  Miranda Davis is a 41 y.o. female   Past medical history of significant past medical history presents with anaphylaxis.  She was bit on the hand by an insect and subsequently developed skin rash itching, hoarse voice, and chest tightness with dyspnea.    EMS gave the patient anaphylaxis medications including EpiPen, Solu-Medrol, Pepcid and Benadryl with rapid improvement of symptoms.  History was obtained via EMS and the patient as well as review of external medical notes from urgent care earlier today.      Physical Exam   Triage Vital Signs: ED Triage Vitals  Enc Vitals Group     BP 11/24/21 2027 (!) 139/98     Pulse Rate 11/24/21 2027 100     Resp 11/24/21 2027 18     Temp 11/24/21 2027 98.2 F (36.8 C)     Temp src --      SpO2 11/24/21 2027 100 %     Weight 11/24/21 2028 157 lb (71.2 kg)     Height 11/24/21 2028 5\' 8"  (1.727 m)     Head Circumference --      Peak Flow --      Pain Score 11/24/21 2028 0     Pain Loc --      Pain Edu? --      Excl. in GC? --     Most recent vital signs: Vitals:   11/24/21 2134 11/24/21 2155  BP: (!) 140/90 132/83  Pulse: 92 96  Resp: 18 16  Temp:  98.2 F (36.8 C)  SpO2: 98% 99%    General: Awake, no distress.  CV:  Good peripheral perfusion.  Resp:  Normal effort.  No wheezing. Abd:  No distention.  Nontender. Other:  Skin rash to the arms bilaterally torso and abdomen.  No oropharyngeal involvement.  + hoarse voice.   ED Results / Procedures / Treatments   PROCEDURES:  Critical Care performed: No  Procedures   MEDICATIONS ORDERED IN ED: Medications - No data to display  IMPRESSION / MDM / ASSESSMENT AND PLAN / ED COURSE  I reviewed the triage vital signs and the nursing notes.                               Differential diagnosis includes, but is not limited to, anaphylaxis   MDM: Patient given medications by EMS with good results, observation in the emergency department with near resolution of all symptoms.  Patient is discharged home with antihistamine, steroid burst, EpiPen.  she will follow-up with PMD and return with any worsening.   Patient's presentation is most consistent with acute presentation with potential threat to life or bodily function.       FINAL CLINICAL IMPRESSION(S) / ED DIAGNOSES   Final diagnoses:  Anaphylaxis, initial encounter     Rx / DC Orders   ED Discharge Orders          Ordered    EPINEPHrine (EPIPEN 2-PAK) 0.3 mg/0.3 mL IJ SOAJ injection  As needed        11/24/21 2139    diphenhydrAMINE (BENADRYL ALLERGY) 25 mg capsule  Every 8 hours PRN        11/24/21 2139    predniSONE (DELTASONE)  50 MG tablet        11/24/21 2139             Note:  This document was prepared using Dragon voice recognition software and may include unintentional dictation errors.    Pilar Jarvis, MD 11/25/21 [phone]

## 2021-11-24 NOTE — ED Triage Notes (Signed)
Patient arrived with significant other .  Patient possibly stung by wasp prior to arrival approximately 7:15 pm.  Patient has hoarseness,skin is red all over and does have hives.  Patient can tell a difference in breathing.  Skin warm and slightly clammy.  Patient took one benadryl prior to arrival to [facility].    Patient is answering questions appropriately.  Family member with patient.    Notified Jere, NP immediately on arrival to room 1.

## 2021-11-24 NOTE — ED Provider Notes (Signed)
[facility]    CSN: [id number] Arrival date & time: 11/24/21  1931      History   Chief Complaint Chief Complaint  Patient presents with   Allergic Reaction    HPI Miranda Davis is a 41 y.o. female.   HPI  41 year old female here for allergic reaction.  I was called to the room for evaluation of a patient who is bright red and exhibiting a muffled voice who was stung by yellow jackets approximately 50 minutes prior to arrival.  She took 1 p.o. Benadryl tablet before coming in for evaluation.  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  She states that she is having a lot of itching but she denies any shortness of breath.  Past Medical History:  Diagnosis Date   Asthma    Complication of anesthesia    one side take of epidural   Hx of varicella    Pyelonephritis 2011   not during pregnancy    Patient Active Problem List   Diagnosis Date Noted   Bacterial conjunctivitis of left eye 04/21/2021   Encounter to establish care 06/12/2020   Fatigue 06/12/2020   Cutaneous candidiasis 06/12/2020   Vitamin D deficiency 09/07/2017   Hemorrhoids 09/07/2017   Eczema 03/09/2017   Healthcare maintenance 03/09/2017   Postpartum care following vaginal delivery 08/24/2014   Status post vacuum-assisted vaginal delivery 08/24/2014    Past Surgical History:  Procedure Laterality Date   EYE SURGERY  2008   NO PAST SURGERIES      OB History     Gravida  3   Para  3   Term  3   Preterm  0   AB  0   Living  3      SAB  0   IAB  0   Ectopic  0   Multiple  0   Live Births  3            Home Medications    Prior to Admission medications   Medication Sig Start Date End Date Taking? Authorizing Provider  hydrocortisone cream 0.5 % Apply 1 application topically daily as needed for itching (rash).    [provider]  ibuprofen (ADVIL,MOTRIN) 600 MG tablet Take 1 tablet (600 mg total) by  mouth every 6 (six) hours as needed. 08/26/14   Sanjuana Kava, MD  norgestrel-ethinyl estradiol (LO/OVRAL,CRYSELLE) 0.3-30 MG-MCG tablet Take 1 tablet by mouth daily.    [provider]  trimethoprim-polymyxin b (POLYTRIM) ophthalmic solution Insert 1 drop into both eyes every 2 to 3 hours while awake for next 7 days. 04/21/21   Ronnell Freshwater, NP    Family History Family History  Problem Relation Age of Onset   Healthy Mother    Healthy Father    Healthy Sister    Healthy Daughter    Healthy Son    COPD Paternal Grandmother        BREAST   Healthy Son     Social History Social History   Tobacco Use   Smoking status: Never   Smokeless tobacco: Never  Vaping Use   Vaping Use: Never used  Substance Use Topics   Alcohol use: Yes    Alcohol/week: 3.0 standard drinks of alcohol    Types: 3 Glasses of wine per week   Drug use: No     Allergies   Patient has no known allergies.   Review of  Systems Review of Systems  HENT:  Positive for voice change. Negative for facial swelling and trouble swallowing.   Respiratory:  Negative for shortness of breath.   Skin:  Positive for color change.     Physical Exam Triage Vital Signs ED Triage Vitals  Enc Vitals Group     BP      Pulse      Resp      Temp      Temp src      SpO2      Weight      Height      Head Circumference      Peak Flow      Pain Score      Pain Loc      Pain Edu?      Excl. in GC?    No data found.  Updated Vital Signs BP (!) 143/111 (BP Location: Left Arm)   Pulse (!) 130   SpO2 100%   Visual Acuity Right Eye Distance:   Left Eye Distance:   Bilateral Distance:    Right Eye Near:   Left Eye Near:    Bilateral Near:     Physical Exam Vitals and nursing note reviewed.  Constitutional:      General: She is in acute distress.     Appearance: Normal appearance.  HENT:     Head: Normocephalic and atraumatic.     Mouth/Throat:     Mouth: Mucous membranes are moist.      Pharynx: Oropharynx is clear. Posterior oropharyngeal erythema present. No oropharyngeal exudate.  Eyes:     Extraocular Movements: Extraocular movements intact.     Pupils: Pupils are equal, round, and reactive to light.  Cardiovascular:     Rate and Rhythm: Regular rhythm. Tachycardia present.     Pulses: Normal pulses.     Heart sounds: Normal heart sounds. No murmur heard.    No friction rub. No gallop.  Pulmonary:     Effort: Pulmonary effort is normal.     Breath sounds: No stridor. No wheezing, rhonchi or rales.  Skin:    Capillary Refill: Capillary refill takes less than 2 seconds.     Findings: Erythema present.  Neurological:     General: No focal deficit present.     Mental Status: She is alert and oriented to person, place, and time.  Psychiatric:        Mood and Affect: Mood normal.        Behavior: Behavior normal.        Thought Content: Thought content normal.        Judgment: Judgment normal.      UC Treatments / Results  Labs (all labs ordered are listed, but only abnormal results are displayed) Labs Reviewed - No data to display  EKG   Radiology No results found.  Procedures Procedures (including critical care time)  Medications Ordered in UC Medications  methylPREDNISolone sodium succinate (SOLU-MEDROL) 125 mg/2 mL injection 125 mg (has no administration in time range)  methylPREDNISolone sodium succinate (SOLU-MEDROL) 125 mg/2 mL injection 125 mg (125 mg Intramuscular Given 11/24/21 1942)  famotidine (PEPCID) tablet 40 mg (40 mg Oral Given 11/24/21 1938)  diphenhydrAMINE (BENADRYL) capsule 25 mg (25 mg Oral Given 11/24/21 1938)    Initial Impression / Assessment and Plan / UC Course  I have reviewed the triage vital signs and the nursing notes.  Pertinent labs & imaging results that were available during my care of the patient  were reviewed by me and considered in my medical decision making (see chart for details).   Patient is a pleasant  41 year old female here for evaluation of allergic reaction following being stung by yellow jacket 15 minutes prior to her arrival here.  She has had similar reactions to wasp stings but has not been stung by a yellow jacket.  She does not have an EpiPen on hand.  Patient states that she is not having any shortness of breath but she does have a hoarse voice.  Oropharyngeal exam reveals mild erythema in the posterior pharynx but no edema appreciated.  No stridor auscultated over the trachea.  Patient has clear lung sounds in all lung fields.  Her heart sounds reflect a tachycardic rhythm with S1-S2 heart sounds.  She is very flushed.  EMS has been activated and I will hold on epi at this time.  Plan is to give 125 mg of Solu-Medrol IM, 25 Benadryl p.o., and 40 of Pepcid p.o.  [facility] has arrived, report given and care transferred.   Final Clinical Impressions(s) / UC Diagnoses   Final diagnoses:  Allergic reaction to bee sting     Discharge Instructions      Please go to the ER and [facility] for evaluation of your bee sting.     ED Prescriptions   None    PDMP not reviewed this encounter.   Becky Augusta, NP 11/24/21 1944

## 2021-11-24 NOTE — Discharge Instructions (Addendum)
   Thank you for choosing us for your health care today!  Please see your primary doctor this week for a follow up appointment.   If you do not have a primary doctor call the following clinics to establish care:  If you have insurance:  [facility] [phone] [address] [place] [address]   [facility]  [phone] [address] [place] [address]   If you do not have insurance:  [facility]  [phone] [address] [place] [address]  Sometimes, in the early stages of certain disease courses it is difficult to detect in the emergency department evaluation -- so, it is important that you continue to monitor your symptoms and call your doctor right away or return to the emergency department if you develop any new or worsening symptoms.  It was my pleasure to care for you today.   Alyssa Rotondo S. Jaidon Ellery, MD  

## 2021-11-24 NOTE — ED Triage Notes (Signed)
Patient BIB EMS from Urgent Care for evaluation after yellow jacket sting.  Was given Epi, Solu-Medrol, Pepcid, and Benadryl at Urgent Care prior to arrival.  C/o hoarseness and red skin rash.  No difficulty breathing or tongue swelling.

## 2021-11-24 NOTE — ED Notes (Signed)
Patient resting quietly at this time.  No complaints voiced.  States she is feeling better after medications.  Will continue to monitor

## 2021-11-24 NOTE — Discharge Instructions (Addendum)
Please go to the ER and [facility] for evaluation of your bee sting.

## 2021-11-24 NOTE — ED Notes (Signed)
Patient is being discharged from the Urgent Care and sent to the Emergency Department via EMS . Per Tomma Rakers, NP, patient is in need of higher level of care due to allergic reaction. Patient is aware and verbalizes understanding of plan of care.  Vitals:   11/24/21 1941  BP: (!) 143/111  Pulse: (!) 130  SpO2: 100%

## 2021-11-24 NOTE — ED Triage Notes (Signed)
Patient reports to UC for an allergic reaction for a bee sting.   Presents with hives all over and complaining of difficulty breathing.

## 2022-12-30 ENCOUNTER — Encounter: Payer: Self-pay | Admitting: Family Medicine

## 2022-12-30 ENCOUNTER — Ambulatory Visit (INDEPENDENT_AMBULATORY_CARE_PROVIDER_SITE_OTHER): Payer: BC Managed Care – PPO | Admitting: Family Medicine

## 2022-12-30 VITALS — BP 104/72 | HR 76 | Resp 18 | Ht 68.0 in | Wt 166.0 lb

## 2022-12-30 DIAGNOSIS — Z91030 Bee allergy status: Secondary | ICD-10-CM | POA: Diagnosis not present

## 2022-12-30 DIAGNOSIS — E559 Vitamin D deficiency, unspecified: Secondary | ICD-10-CM | POA: Diagnosis not present

## 2022-12-30 DIAGNOSIS — Z136 Encounter for screening for cardiovascular disorders: Secondary | ICD-10-CM

## 2022-12-30 DIAGNOSIS — Z1159 Encounter for screening for other viral diseases: Secondary | ICD-10-CM

## 2022-12-30 DIAGNOSIS — Z0000 Encounter for general adult medical examination without abnormal findings: Secondary | ICD-10-CM

## 2022-12-30 DIAGNOSIS — E663 Overweight: Secondary | ICD-10-CM

## 2022-12-30 DIAGNOSIS — Z6825 Body mass index (BMI) 25.0-25.9, adult: Secondary | ICD-10-CM

## 2022-12-30 MED ORDER — EPINEPHRINE 0.3 MG/0.3ML IJ SOAJ: 0.3000 mg | INTRAMUSCULAR | 0 refills | Status: DC | PRN
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
## 2022-12-30 NOTE — Patient Instructions (Signed)
Great work staying up-to-date on all of your preventative care!  I also love your approach to nutrition and movement, keep up the good work :)

## 2023-01-07 ENCOUNTER — Other Ambulatory Visit: Payer: BC Managed Care – PPO

## 2023-01-07 DIAGNOSIS — Z136 Encounter for screening for cardiovascular disorders: Secondary | ICD-10-CM

## 2023-01-07 DIAGNOSIS — Z1159 Encounter for screening for other viral diseases: Secondary | ICD-10-CM

## 2023-01-07 DIAGNOSIS — E559 Vitamin D deficiency, unspecified: Secondary | ICD-10-CM

## 2023-01-07 DIAGNOSIS — Z6825 Body mass index (BMI) 25.0-25.9, adult: Secondary | ICD-10-CM

## 2023-01-08 LAB — CBC WITH DIFFERENTIAL/PLATELET
Basophils Absolute: 0.1 10*3/uL (ref 0.0–0.2)
Basos: 1 %
EOS (ABSOLUTE): 0.4 10*3/uL (ref 0.0–0.4)
Eos: 7 %
Hematocrit: 45.1 % (ref 34.0–46.6)
Hemoglobin: 15.2 g/dL (ref 11.1–15.9)
Immature Grans (Abs): 0 10*3/uL (ref 0.0–0.1)
Immature Granulocytes: 0 %
Lymphocytes Absolute: 1.5 10*3/uL (ref 0.7–3.1)
Lymphs: 25 %
MCH: 32.1 pg (ref 26.6–33.0)
MCHC: 33.7 g/dL (ref 31.5–35.7)
MCV: 95 fL (ref 79–97)
Monocytes Absolute: 0.5 10*3/uL (ref 0.1–0.9)
Monocytes: 8 %
Neutrophils Absolute: 3.7 10*3/uL (ref 1.4–7.0)
Neutrophils: 59 %
Platelets: 221 10*3/uL (ref 150–450)
RBC: 4.73 x10E6/uL (ref 3.77–5.28)
RDW: 11.8 % (ref 11.7–15.4)
WBC: 6.1 10*3/uL (ref 3.4–10.8)

## 2023-01-08 LAB — COMPREHENSIVE METABOLIC PANEL
ALT: 37 [IU]/L — ABNORMAL HIGH (ref 0–32)
AST: 32 [IU]/L (ref 0–40)
Albumin: 4.4 g/dL (ref 3.9–4.9)
Alkaline Phosphatase: 69 [IU]/L (ref 44–121)
BUN/Creatinine Ratio: 12 (ref 9–23)
BUN: 11 mg/dL (ref 6–20)
Bilirubin Total: 0.6 mg/dL (ref 0.0–1.2)
CO2: 22 mmol/L (ref 20–29)
Calcium: 9.1 mg/dL (ref 8.7–10.2)
Chloride: 106 mmol/L (ref 96–106)
Creatinine, Ser: 0.89 mg/dL (ref 0.57–1.00)
Globulin, Total: 3 g/dL (ref 1.5–4.5)
Glucose: 87 mg/dL (ref 70–99)
Potassium: 4.8 mmol/L (ref 3.5–5.2)
Sodium: 142 mmol/L (ref 134–144)
Total Protein: 7.4 g/dL (ref 6.0–8.5)
eGFR: 85 mL/min/{1.73_m2} (ref >59)

## 2023-01-08 LAB — VITAMIN D 25 HYDROXY (VIT D DEFICIENCY, FRACTURES): Vit D, 25-Hydroxy: 41.8 ng/mL (ref 30.0–100.0)

## 2023-01-08 LAB — LIPID PANEL
Chol/HDL Ratio: 3.5 {ratio} (ref 0.0–4.4)
Cholesterol, Total: 196 mg/dL (ref 100–199)
HDL: 56 mg/dL (ref >39)
LDL Chol Calc (NIH): 130 mg/dL — ABNORMAL HIGH (ref 0–99)
Triglycerides: 54 mg/dL (ref 0–149)
VLDL Cholesterol Cal: 10 mg/dL (ref 5–40)

## 2023-01-08 LAB — HEMOGLOBIN A1C
Est. average glucose Bld gHb Est-mCnc: 97 mg/dL
Hgb A1c MFr Bld: 5 % (ref 4.8–5.6)

## 2023-01-08 LAB — TSH RFX ON ABNORMAL TO FREE T4: TSH: 1.87 u[IU]/mL (ref 0.450–4.500)

## 2023-01-08 LAB — HEPATITIS C ANTIBODY: Hep C Virus Ab: NONREACTIVE

## 2023-01-14 LAB — HM MAMMOGRAPHY

## 2023-01-19 ENCOUNTER — Other Ambulatory Visit: Payer: Self-pay | Admitting: Obstetrics and Gynecology

## 2023-01-19 ENCOUNTER — Telehealth: Payer: Self-pay

## 2023-01-19 DIAGNOSIS — R928 Other abnormal and inconclusive findings on diagnostic imaging of breast: Secondary | ICD-10-CM

## 2023-01-19 NOTE — Telephone Encounter (Signed)
Records requested on 12/30/22

## 2023-01-19 NOTE — Telephone Encounter (Signed)
-----   Message from Melida Quitter sent at 01/19/2023 12:54 PM EDT ----- Please request mammo results from [facility] :)

## 2023-02-04 ENCOUNTER — Ambulatory Visit
Admission: RE | Admit: 2023-02-04 | Discharge: 2023-02-04 | Disposition: A | Discharge status: Home / Self Care | Payer: BC Managed Care – PPO | Source: Ambulatory Visit | Attending: Obstetrics and Gynecology | Admitting: Obstetrics and Gynecology

## 2023-02-04 ENCOUNTER — Other Ambulatory Visit: Payer: Self-pay | Admitting: Obstetrics and Gynecology

## 2023-02-04 DIAGNOSIS — N6310 Unspecified lump in the right breast, unspecified quadrant: Secondary | ICD-10-CM

## 2023-02-04 DIAGNOSIS — R928 Other abnormal and inconclusive findings on diagnostic imaging of breast: Secondary | ICD-10-CM

## 2023-02-04 DIAGNOSIS — R921 Mammographic calcification found on diagnostic imaging of breast: Secondary | ICD-10-CM

## 2023-02-09 ENCOUNTER — Ambulatory Visit
Admission: RE | Admit: 2023-02-09 | Discharge: 2023-02-09 | Disposition: A | Discharge status: Home / Self Care | Payer: BC Managed Care – PPO | Source: Ambulatory Visit | Attending: Obstetrics and Gynecology | Admitting: Obstetrics and Gynecology

## 2023-02-09 DIAGNOSIS — R921 Mammographic calcification found on diagnostic imaging of breast: Secondary | ICD-10-CM

## 2023-02-09 DIAGNOSIS — N6310 Unspecified lump in the right breast, unspecified quadrant: Secondary | ICD-10-CM

## 2023-02-09 DIAGNOSIS — R928 Other abnormal and inconclusive findings on diagnostic imaging of breast: Secondary | ICD-10-CM

## 2023-02-09 HISTORY — PX: BREAST BIOPSY: SHX20

## 2023-02-10 LAB — SURGICAL PATHOLOGY

## 2023-02-15 ENCOUNTER — Other Ambulatory Visit: Payer: Self-pay | Admitting: General Surgery

## 2023-02-15 DIAGNOSIS — D0511 Intraductal carcinoma in situ of right breast: Secondary | ICD-10-CM

## 2023-02-16 ENCOUNTER — Telehealth: Payer: Self-pay | Admitting: Radiation Oncology

## 2023-02-16 NOTE — Telephone Encounter (Signed)
Left message for patient to call back to schedule consult per 11/22 referral.

## 2023-02-21 DIAGNOSIS — C50919 Malignant neoplasm of unspecified site of unspecified female breast: Secondary | ICD-10-CM

## 2023-02-21 HISTORY — DX: Malignant neoplasm of unspecified site of unspecified female breast: C50.919

## 2023-02-22 ENCOUNTER — Other Ambulatory Visit: Payer: BC Managed Care – PPO

## 2023-02-22 DIAGNOSIS — D0511 Intraductal carcinoma in situ of right breast: Secondary | ICD-10-CM | POA: Insufficient documentation

## 2023-02-22 NOTE — Progress Notes (Signed)
Approximately Radiation Oncology         [phone]) [phone] ________________________________  Name: Miranda Davis        MRN: [id number]  Date of Service: 02/25/2023 DOB: 02-03-1981  BJ:[id number], Simmie Davies, PA  Griselda Miner, MD     REFERRING PHYSICIAN: [name], MD   DIAGNOSIS: The encounter diagnosis was Ductal carcinoma in situ (DCIS) of right breast.   HISTORY OF PRESENT ILLNESS: Miranda Davis is a 42 y.o. female seen in the multidisciplinary breast clinic for a new diagnosis of right breast cancer. The patient was noted to have screening detected abnormality with calcifications in the right breast.  She returned for diagnostic workup on 02/04/2023 which showed persistent asymmetry with associated coarse scattered genus calcifications spanning 1.3 cm inferior to this was an oval mass measuring 1 cm.  By ultrasound, this was in the 2 o'clock position measuring 1.3 cm and also approximately a centimeter away was a near anechoic oval mass with indistinct margins which has an extending tail from it altogether this measured 2.3 cm.  In the 12 o'clock position there was a 1.5 cm mass as well.  Multiple normal-appearing right axillary lymph nodes were appreciated.  An addendum indicated that there was also findings on the screening mammogram in the upper outer aspect of the breast that were not fully evaluated. Biopsies on 02/09/2023 showed high-grade DCIS in the 2:00 right breast specimen with calcifications and necrosis, the second 2:00 specimen 4 cm from nipple with the heart clip was consistent with fibrocystic change, and the 12:00 spiral clip position showed benign breast parenchyma with no specific pathologic changes.  The DCIS was ER/PR positive.  She did not have additional ultrasound at the time but it was recommended that she have an MRI scan which has been ordered.  She favors breast conservation and is seen to discuss adjuvant radiation at the appropriate time.   PREVIOUS RADIATION  THERAPY: {EXAM; YES/NO:19492::"No"}   PAST MEDICAL HISTORY:  Past Medical History:  Diagnosis Date   Asthma    Complication of anesthesia    one side take of epidural   Hx of varicella    Pyelonephritis 2011   not during pregnancy       PAST SURGICAL HISTORY: Past Surgical History:  Procedure Laterality Date   BREAST BIOPSY Right 02/09/2023   3 clips   BREAST BIOPSY Right 02/09/2023   [place] RT BREAST BX W LOC DEV 1ST LESION IMG BX SPEC US GUIDE 02/09/2023 [facility]   BREAST BIOPSY Right 02/09/2023   [place] RT BREAST BX W LOC DEV EA ADD LESION IMG BX SPEC US GUIDE 02/09/2023 [facility]   BREAST BIOPSY Right 02/09/2023   [place] RT BREAST BX W LOC DEV EA ADD LESION IMG BX SPEC US GUIDE 02/09/2023 [facility]   EYE SURGERY  03/23/2006   NO PAST SURGERIES       FAMILY HISTORY:  Family History  Problem Relation Age of Onset   Healthy Mother    Healthy Father    Healthy Sister    COPD Paternal Grandmother        BREAST CANCER   Healthy Daughter    Healthy Son    Healthy Son      SOCIAL HISTORY:  reports that she has never smoked. She has never been exposed to tobacco smoke. She has never used smokeless tobacco. She reports current alcohol use of about 3.0 standard drinks of alcohol per week. She reports that she does  not use drugs.  The patient is married and lives in [place].  She is a Runner, broadcasting/film/video in [employer]. ***   ALLERGIES: Bee venom and Wasp venom   MEDICATIONS:  Current Outpatient Medications  Medication Sig Dispense Refill   diphenhydrAMINE (BENADRYL ALLERGY) 25 mg capsule Take 1 capsule (25 mg total) by mouth every 8 (eight) hours as needed for allergies or itching. 30 capsule 0   EPINEPHrine (EPIPEN 2-PAK) 0.3 mg/0.3 mL IJ SOAJ injection Inject 0.3 mg into the muscle as needed for anaphylaxis. 1 each 0   hydrocortisone cream 0.5 % Apply 1 application topically daily as needed for itching (rash).     ibuprofen (ADVIL,MOTRIN) 600 MG  tablet Take 1 tablet (600 mg total) by mouth every 6 (six) hours as needed. 60 tablet 3   norgestrel-ethinyl estradiol (LO/OVRAL,CRYSELLE) 0.3-30 MG-MCG tablet Take 1 tablet by mouth daily.     predniSONE (DELTASONE) 50 MG tablet Take one tablet daily for two days, starting on Tuesday 11/25/2021 2 tablet 0   No current facility-administered medications for this visit.     REVIEW OF SYSTEMS: On review of systems, the patient reports that she is doing ***     PHYSICAL EXAM:  Wt Readings from Last 3 Encounters:  12/30/22 166 lb (75.3 kg)  11/24/21 157 lb (71.2 kg)  04/21/21 157 lb 8 oz (71.4 kg)   Temp Readings from Last 3 Encounters:  11/24/21 98.2 F (36.8 C) (Oral)  04/21/21 98 F (36.7 C)  06/12/20 99.2 F (37.3 C)   BP Readings from Last 3 Encounters:  12/30/22 104/72  11/24/21 132/83  11/24/21 (!) 143/111   Pulse Readings from Last 3 Encounters:  12/30/22 76  11/24/21 96  11/24/21 (!) 130    In general this is a well appearing *** female in no acute distress. She's alert and oriented x4 and appropriate throughout the examination. Cardiopulmonary assessment is negative for acute distress and she exhibits normal effort. Bilateral breast exam is deferred.    ECOG = ***  0 - Asymptomatic (Fully active, able to carry on all predisease activities without restriction)  1 - Symptomatic but completely ambulatory (Restricted in physically strenuous activity but ambulatory and able to carry out work of a light or sedentary nature. For example, light housework, office work)  2 - Symptomatic, <50% in bed during the day (Ambulatory and capable of all self care but unable to carry out any work activities. Up and about more than 50% of waking hours)  3 - Symptomatic, >50% in bed, but not bedbound (Capable of only limited self-care, confined to bed or chair 50% or more of waking hours)  4 - Bedbound (Completely disabled. Cannot carry on any self-care. Totally confined to bed or  chair)  5 - Death   Santiago Glad MM, Creech RH, Tormey DC, et al. [phone]). "Toxicity and response criteria of the [facility] Group". Am. Evlyn Clines. Oncol. 5 (6): 649-55    LABORATORY DATA:  Lab Results  Component Value Date   WBC 6.1 01/07/2023   HGB 15.2 01/07/2023   HCT 45.1 01/07/2023   MCV 95 01/07/2023   PLT 221 01/07/2023   Lab Results  Component Value Date   NA 142 01/07/2023   K 4.8 01/07/2023   CL 106 01/07/2023   CO2 22 01/07/2023   Lab Results  Component Value Date   ALT 37 (H) 01/07/2023   AST 32 01/07/2023   ALKPHOS 69 01/07/2023   BILITOT 0.6 01/07/2023  RADIOGRAPHY: [place] RT BREAST BX W LOC DEV 1ST LESION IMG BX SPEC US GUIDE  Addendum Date: 02/16/2023   ADDENDUM REPORT: 02/16/2023 07:24 ADDENDUM: Pathology revealed: DUCTAL CARCINOMA IN SITU, HIGH GRADE, NECROSIS: PRESENT, CALCIFICATIONS: PRESENT of the RIGHT breast, 2 o'clock, 5 cmfn, (coil clip). This was found to be concordant by Dr. Quincy Carnes. Pathology revealed: FIBROCYSTIC CHANGE WITH MICROCYSTS of the RIGHT breast, 2 o'clock, 4 cmfn, (heart clip). This was found to be concordant by Dr. Quincy Carnes. Pathology revealed: BENIGN BREAST PARENCHYMA WITH NO SPECIFIC HISTOPATHOLOGIC CHANGES of the RIGHT breast, 12 o'clock, 5 cmfn, (HydroMark spiral clip). This was found to be concordant by Dr. Quincy Carnes. Pathology results were discussed with the patient by telephone. The patient reported doing well after the biopsies with tenderness at the sited. Post biopsy instructions and care were reviewed and questions were answered. The patient was encouraged to call [facility] for any additional concerns. My direct phone number was provided. Surgical consultation has been arranged with Dr. Chevis Pretty at [facility] on February 11, 2023. Recommendation for a bilateral breast MRI for further evaluation of extent of disease given the high grade histology, patient's  age, breast density, and the multiplicity of findings. Pathology results reported by Rene Kocher, RN on 02/10/2023. Electronically Signed   By: Hulan Saas M.D.   On: 02/16/2023 07:24   Result Date: 02/16/2023 CLINICAL DATA:  42 year old with 3 baseline screening detected indeterminate masses involving the RIGHT breast. The mass at 2 o'clock 5 cm from the nipple is associated with calcifications in measures 1.3 cm. The mass at 2 o'clock 4 cm from the nipple which is cystic with a hypoechoic tail measures 2.3 cm in total. The mass at 12 o'clock 5 cm from nipple measures approximately 0.6 cm. EXAM: ULTRASOUND GUIDED RIGHT BREAST CORE NEEDLE BIOPSY x 3 COMPARISON:  Previous exam(s). PROCEDURE: I met with the patient and we discussed the procedure of ultrasound-guided biopsy, including benefits and alternatives. We discussed the high likelihood of a successful procedure. We discussed the risks of the procedure, including infection, bleeding, tissue injury, clip migration, and inadequate sampling. Informed written consent was given. The usual time-out protocol was performed immediately prior to the procedure. #1) Lesion quadrant: UPPER INNER QUADRANT, 2 o'clock 4 cm from the nipple. Using sterile technique with chlorhexidine as skin antisepsis, 1% Lidocaine as local anesthetic, under direct ultrasound visualization, a 12 gauge Bard Marquee core needle device placed through an 11 gauge introducer needle was used to perform biopsy of the mass associated with calcifications in the UPPER INNER QUADRANT at 2 o'clock 4 cm from the nipple using a lateral approach. At the conclusion of the procedure, a coil shaped tissue marker clip was deployed into the biopsy cavity. # 2) Lesion quadrant: UPPER INNER QUADRANT, 2 o'clock 5 cm from the nipple. Using sterile technique with chlorhexidine as skin antisepsis, 1% lidocaine as local anesthetic, under direct ultrasound visualization, a 12 gauge Bard Marquee core needle device  placed through an 11 gauge introducer needle was used perform biopsy of the cystic mass with a hypoechoic tail in the UPPER INNER QUADRANT at 2 o'clock 4 cm from the nipple using a lateral approach from the same dermatotomy site. The cystic portion of the mass collapsed with the biopsy procedure. At the conclusion of the procedure, a heart shaped tissue marker clip was deployed into the biopsy cavity. # 3) Lesion quadrant: Upper breast, 12 o'clock location. Using sterile technique with chlorhexidine  as skin antisepsis, 1% lidocaine and 1% lidocaine with epinephrine as local anesthetic, under direct ultrasound visualization, a 12 gauge Bard Marquee core needle device placed through an 11 gauge introducer needle was used perform biopsy of the mass in the upper breast at 12 o'clock using an inferolateral approach from the same dermatotomy site. At the conclusion of the procedure, a HydroMark spiral shaped tissue marker clip was deployed into the biopsy cavity. The patient tolerated the procedures well without apparent immediate complications. Follow up 2 view mammogram was performed in order to confirm clip placement and was dictated separately. IMPRESSION: Ultrasound guided core needle biopsy of 3 indeterminate RIGHT breast masses. Electronically Signed: By: Hulan Saas M.D. On: 02/09/2023 11:39   [place] RT BREAST BX W LOC DEV EA ADD LESION IMG BX SPEC US GUIDE  Addendum Date: 02/16/2023   ADDENDUM REPORT: 02/16/2023 07:24 ADDENDUM: Pathology revealed: DUCTAL CARCINOMA IN SITU, HIGH GRADE, NECROSIS: PRESENT, CALCIFICATIONS: PRESENT of the RIGHT breast, 2 o'clock, 5 cmfn, (coil clip). This was found to be concordant by Dr. Quincy Carnes. Pathology revealed: FIBROCYSTIC CHANGE WITH MICROCYSTS of the RIGHT breast, 2 o'clock, 4 cmfn, (heart clip). This was found to be concordant by Dr. Quincy Carnes. Pathology revealed: BENIGN BREAST PARENCHYMA WITH NO SPECIFIC HISTOPATHOLOGIC CHANGES of the RIGHT breast, 12  o'clock, 5 cmfn, (HydroMark spiral clip). This was found to be concordant by Dr. Quincy Carnes. Pathology results were discussed with the patient by telephone. The patient reported doing well after the biopsies with tenderness at the sited. Post biopsy instructions and care were reviewed and questions were answered. The patient was encouraged to call [facility] for any additional concerns. My direct phone number was provided. Surgical consultation has been arranged with Dr. Chevis Pretty at [facility] on February 11, 2023. Recommendation for a bilateral breast MRI for further evaluation of extent of disease given the high grade histology, patient's age, breast density, and the multiplicity of findings. Pathology results reported by Rene Kocher, RN on 02/10/2023. Electronically Signed   By: Hulan Saas M.D.   On: 02/16/2023 07:24   Result Date: 02/16/2023 CLINICAL DATA:  42 year old with 3 baseline screening detected indeterminate masses involving the RIGHT breast. The mass at 2 o'clock 5 cm from the nipple is associated with calcifications in measures 1.3 cm. The mass at 2 o'clock 4 cm from the nipple which is cystic with a hypoechoic tail measures 2.3 cm in total. The mass at 12 o'clock 5 cm from nipple measures approximately 0.6 cm. EXAM: ULTRASOUND GUIDED RIGHT BREAST CORE NEEDLE BIOPSY x 3 COMPARISON:  Previous exam(s). PROCEDURE: I met with the patient and we discussed the procedure of ultrasound-guided biopsy, including benefits and alternatives. We discussed the high likelihood of a successful procedure. We discussed the risks of the procedure, including infection, bleeding, tissue injury, clip migration, and inadequate sampling. Informed written consent was given. The usual time-out protocol was performed immediately prior to the procedure. #1) Lesion quadrant: UPPER INNER QUADRANT, 2 o'clock 4 cm from the nipple. Using sterile technique with chlorhexidine as  skin antisepsis, 1% Lidocaine as local anesthetic, under direct ultrasound visualization, a 12 gauge Bard Marquee core needle device placed through an 11 gauge introducer needle was used to perform biopsy of the mass associated with calcifications in the UPPER INNER QUADRANT at 2 o'clock 4 cm from the nipple using a lateral approach. At the conclusion of the procedure, a coil shaped tissue marker clip was deployed into the  biopsy cavity. # 2) Lesion quadrant: UPPER INNER QUADRANT, 2 o'clock 5 cm from the nipple. Using sterile technique with chlorhexidine as skin antisepsis, 1% lidocaine as local anesthetic, under direct ultrasound visualization, a 12 gauge Bard Marquee core needle device placed through an 11 gauge introducer needle was used perform biopsy of the cystic mass with a hypoechoic tail in the UPPER INNER QUADRANT at 2 o'clock 4 cm from the nipple using a lateral approach from the same dermatotomy site. The cystic portion of the mass collapsed with the biopsy procedure. At the conclusion of the procedure, a heart shaped tissue marker clip was deployed into the biopsy cavity. # 3) Lesion quadrant: Upper breast, 12 o'clock location. Using sterile technique with chlorhexidine as skin antisepsis, 1% lidocaine and 1% lidocaine with epinephrine as local anesthetic, under direct ultrasound visualization, a 12 gauge Bard Marquee core needle device placed through an 11 gauge introducer needle was used perform biopsy of the mass in the upper breast at 12 o'clock using an inferolateral approach from the same dermatotomy site. At the conclusion of the procedure, a HydroMark spiral shaped tissue marker clip was deployed into the biopsy cavity. The patient tolerated the procedures well without apparent immediate complications. Follow up 2 view mammogram was performed in order to confirm clip placement and was dictated separately. IMPRESSION: Ultrasound guided core needle biopsy of 3 indeterminate RIGHT breast masses.  Electronically Signed: By: Hulan Saas M.D. On: 02/09/2023 11:39   [place] RT BREAST BX W LOC DEV EA ADD LESION IMG BX SPEC US GUIDE  Addendum Date: 02/16/2023   ADDENDUM REPORT: 02/16/2023 07:24 ADDENDUM: Pathology revealed: DUCTAL CARCINOMA IN SITU, HIGH GRADE, NECROSIS: PRESENT, CALCIFICATIONS: PRESENT of the RIGHT breast, 2 o'clock, 5 cmfn, (coil clip). This was found to be concordant by Dr. Quincy Carnes. Pathology revealed: FIBROCYSTIC CHANGE WITH MICROCYSTS of the RIGHT breast, 2 o'clock, 4 cmfn, (heart clip). This was found to be concordant by Dr. Quincy Carnes. Pathology revealed: BENIGN BREAST PARENCHYMA WITH NO SPECIFIC HISTOPATHOLOGIC CHANGES of the RIGHT breast, 12 o'clock, 5 cmfn, (HydroMark spiral clip). This was found to be concordant by Dr. Quincy Carnes. Pathology results were discussed with the patient by telephone. The patient reported doing well after the biopsies with tenderness at the sited. Post biopsy instructions and care were reviewed and questions were answered. The patient was encouraged to call [facility] for any additional concerns. My direct phone number was provided. Surgical consultation has been arranged with Dr. Chevis Pretty at [facility] on February 11, 2023. Recommendation for a bilateral breast MRI for further evaluation of extent of disease given the high grade histology, patient's age, breast density, and the multiplicity of findings. Pathology results reported by Rene Kocher, RN on 02/10/2023. Electronically Signed   By: Hulan Saas M.D.   On: 02/16/2023 07:24   Result Date: 02/16/2023 CLINICAL DATA:  42 year old with 3 baseline screening detected indeterminate masses involving the RIGHT breast. The mass at 2 o'clock 5 cm from the nipple is associated with calcifications in measures 1.3 cm. The mass at 2 o'clock 4 cm from the nipple which is cystic with a hypoechoic tail measures 2.3 cm in total. The mass at 12 o'clock  5 cm from nipple measures approximately 0.6 cm. EXAM: ULTRASOUND GUIDED RIGHT BREAST CORE NEEDLE BIOPSY x 3 COMPARISON:  Previous exam(s). PROCEDURE: I met with the patient and we discussed the procedure of ultrasound-guided biopsy, including benefits and alternatives. We discussed the high likelihood of  a successful procedure. We discussed the risks of the procedure, including infection, bleeding, tissue injury, clip migration, and inadequate sampling. Informed written consent was given. The usual time-out protocol was performed immediately prior to the procedure. #1) Lesion quadrant: UPPER INNER QUADRANT, 2 o'clock 4 cm from the nipple. Using sterile technique with chlorhexidine as skin antisepsis, 1% Lidocaine as local anesthetic, under direct ultrasound visualization, a 12 gauge Bard Marquee core needle device placed through an 11 gauge introducer needle was used to perform biopsy of the mass associated with calcifications in the UPPER INNER QUADRANT at 2 o'clock 4 cm from the nipple using a lateral approach. At the conclusion of the procedure, a coil shaped tissue marker clip was deployed into the biopsy cavity. # 2) Lesion quadrant: UPPER INNER QUADRANT, 2 o'clock 5 cm from the nipple. Using sterile technique with chlorhexidine as skin antisepsis, 1% lidocaine as local anesthetic, under direct ultrasound visualization, a 12 gauge Bard Marquee core needle device placed through an 11 gauge introducer needle was used perform biopsy of the cystic mass with a hypoechoic tail in the UPPER INNER QUADRANT at 2 o'clock 4 cm from the nipple using a lateral approach from the same dermatotomy site. The cystic portion of the mass collapsed with the biopsy procedure. At the conclusion of the procedure, a heart shaped tissue marker clip was deployed into the biopsy cavity. # 3) Lesion quadrant: Upper breast, 12 o'clock location. Using sterile technique with chlorhexidine as skin antisepsis, 1% lidocaine and 1% lidocaine with  epinephrine as local anesthetic, under direct ultrasound visualization, a 12 gauge Bard Marquee core needle device placed through an 11 gauge introducer needle was used perform biopsy of the mass in the upper breast at 12 o'clock using an inferolateral approach from the same dermatotomy site. At the conclusion of the procedure, a HydroMark spiral shaped tissue marker clip was deployed into the biopsy cavity. The patient tolerated the procedures well without apparent immediate complications. Follow up 2 view mammogram was performed in order to confirm clip placement and was dictated separately. IMPRESSION: Ultrasound guided core needle biopsy of 3 indeterminate RIGHT breast masses. Electronically Signed: By: Hulan Saas M.D. On: 02/09/2023 11:39   MM CLIP PLACEMENT RIGHT  Result Date: 02/09/2023 CLINICAL DATA:  Confirmation of clip placement after ultrasound-guided core needle biopsy of 3 indeterminate RIGHT breast masses identified on baseline screening mammography. EXAM: 2D and 3D DIAGNOSTIC RIGHT MAMMOGRAM POST ULTRASOUND BIOPSY COMPARISON:  Previous exam(s). FINDINGS: 2D and 3D full field CC and mediolateral mammographic images were obtained following ultrasound guided biopsy of 3 indeterminate RIGHT breast masses. The coil shaped tissue marking clip is appropriately positioned at the anterior margin of the biopsied mass associated with calcifications in the UPPER INNER QUADRANT at 2 o'clock 5 cm from the nipple. Calcifications from the anterior portion of the group were removed. The coil clip is approximately 0.6 cm anterior to the residual calcifications. The heart shaped tissue marking clip is appropriately positioned at the site of the biopsied mass in the UPPER INNER QUADRANT at 2 o'clock 4 cm from the nipple. The mass is inconspicuous on the mammogram, as it collapsed during the biopsy procedure. The [facility] spiral shaped tissue marking clip is appropriately positioned at the site of the  biopsied mass at 12 o'clock 5 cm from the nipple. Expected post biopsy changes are present at all 3 sites without evidence of hematoma. IMPRESSION: 1. Appropriate positioning of the coil shaped biopsy marking clip at the anterior margin of the biopsied  mass with calcifications in the UPPER INNER QUADRANT of the RIGHT breast at 2 o'clock 5 cm from the nipple. The clip is approximately 0.5 cm anterior to the residual calcifications. 2. Appropriate positioning of the heart shaped tissue marking clip at the site of the biopsied mass in the UPPER INNER QUADRANT of the RIGHT breast at 2 o'clock 4 cm from the nipple. The mass collapsed with the biopsy procedure and is inconspicuous on the current mammogram. 3. Appropriate positioning of the [facility] spiral shaped tissue marking clip at the site of the biopsied mass in the upper RIGHT breast at 12 o'clock. Final Assessment: Post Procedure Mammograms for Marker Placement Electronically Signed   By: Hulan Saas M.D.   On: 02/09/2023 11:46   MM 3D DIAGNOSTIC MAMMOGRAM UNILATERAL RIGHT BREAST  Addendum Date: 02/04/2023   ADDENDUM REPORT: 02/04/2023 16:31 ADDENDUM: After the patient had departed from her exam today, an additional area of concern was identified on her screening mammogram in the lateral to upper-outer aspect of the right breast which was not evaluated with ultrasound. When the patient returns for her biopsy, the lateral aspect of the right breast should be scanned with ultrasound for further evaluation. Electronically Signed   By: Frederico Hamman M.D.   On: 02/04/2023 16:31   Result Date: 02/04/2023 CLINICAL DATA:  Screening recall for right breast calcifications and asymmetry. EXAM: DIGITAL DIAGNOSTIC UNILATERAL RIGHT MAMMOGRAM WITH TOMOSYNTHESIS AND CAD; ULTRASOUND RIGHT BREAST LIMITED TECHNIQUE: Right digital diagnostic mammography and breast tomosynthesis was performed. The images were evaluated with computer-aided detection. ; Targeted  ultrasound examination of the right breast was performed COMPARISON:  Previous exam(s). ACR Breast Density Category b: There are scattered areas of fibroglandular density. FINDINGS: Spot compression tomosynthesis images through the medial posterior right breast demonstrates a persistent asymmetry with associated coarse heterogeneous calcifications spanning approximately 1.3 cm. Just inferior to this is an oval mass measuring approximately 1 cm. Ultrasound targeted to the right breast at 2 o'clock, 5 cm from the nipple demonstrates a heterogeneous isoechoic mass measuring 1.3 x 0.6 x 1.1 cm. In the right breast at 2 o'clock, 4 cm from the nipple there is in near anechoic oval mass with indistinct margins, which in the anti radial view has a hypoechoic tail extending from it. All together, this measures 2.3 x 0.5 x 0.8 cm. In the right breast at 12 o'clock, 5 cm from the nipple there is a heterogeneous mass with both near anechoic, hypoechoic and isoechoic portions. Together, this area measures 1.5 x 1.1 x 1.1 cm. Ultrasound of the right axilla demonstrates multiple normal-appearing lymph nodes. IMPRESSION: 1. There is an indeterminate 1.3 cm mass with calcifications in the right breast at 2 o'clock, 5 cm from the nipple. 2. There is an indeterminate 2.3 cm mass in the right breast at 2 o'clock 4 cm from the nipple. 3. There is an indeterminate 1.5 cm mass in the right breast at 12 o'clock. 4.  No evidence of right axillary lymphadenopathy. RECOMMENDATION: 1. Ultrasound guided biopsy is recommended for the 3 masses in the right breast. We will schedule the patient for the procedure prior to her departure from breast imaging today. 2. If any of the pathology results indicate malignancy is present, bilateral breast MRI is recommended to determine extent of disease given the multiplicity of findings. I have discussed the findings and recommendations with the patient. If applicable, a reminder letter will be sent to the  patient regarding the next appointment. BI-RADS CATEGORY  4: Suspicious. Electronically  Signed: By: Frederico Hamman M.D. On: 02/04/2023 16:15   [place] LIMITED ULTRASOUND INCLUDING AXILLA RIGHT BREAST  Addendum Date: 02/04/2023   ADDENDUM REPORT: 02/04/2023 16:31 ADDENDUM: After the patient had departed from her exam today, an additional area of concern was identified on her screening mammogram in the lateral to upper-outer aspect of the right breast which was not evaluated with ultrasound. When the patient returns for her biopsy, the lateral aspect of the right breast should be scanned with ultrasound for further evaluation. Electronically Signed   By: Frederico Hamman M.D.   On: 02/04/2023 16:31   Result Date: 02/04/2023 CLINICAL DATA:  Screening recall for right breast calcifications and asymmetry. EXAM: DIGITAL DIAGNOSTIC UNILATERAL RIGHT MAMMOGRAM WITH TOMOSYNTHESIS AND CAD; ULTRASOUND RIGHT BREAST LIMITED TECHNIQUE: Right digital diagnostic mammography and breast tomosynthesis was performed. The images were evaluated with computer-aided detection. ; Targeted ultrasound examination of the right breast was performed COMPARISON:  Previous exam(s). ACR Breast Density Category b: There are scattered areas of fibroglandular density. FINDINGS: Spot compression tomosynthesis images through the medial posterior right breast demonstrates a persistent asymmetry with associated coarse heterogeneous calcifications spanning approximately 1.3 cm. Just inferior to this is an oval mass measuring approximately 1 cm. Ultrasound targeted to the right breast at 2 o'clock, 5 cm from the nipple demonstrates a heterogeneous isoechoic mass measuring 1.3 x 0.6 x 1.1 cm. In the right breast at 2 o'clock, 4 cm from the nipple there is in near anechoic oval mass with indistinct margins, which in the anti radial view has a hypoechoic tail extending from it. All together, this measures 2.3 x 0.5 x 0.8 cm. In the right breast at 12  o'clock, 5 cm from the nipple there is a heterogeneous mass with both near anechoic, hypoechoic and isoechoic portions. Together, this area measures 1.5 x 1.1 x 1.1 cm. Ultrasound of the right axilla demonstrates multiple normal-appearing lymph nodes. IMPRESSION: 1. There is an indeterminate 1.3 cm mass with calcifications in the right breast at 2 o'clock, 5 cm from the nipple. 2. There is an indeterminate 2.3 cm mass in the right breast at 2 o'clock 4 cm from the nipple. 3. There is an indeterminate 1.5 cm mass in the right breast at 12 o'clock. 4.  No evidence of right axillary lymphadenopathy. RECOMMENDATION: 1. Ultrasound guided biopsy is recommended for the 3 masses in the right breast. We will schedule the patient for the procedure prior to her departure from breast imaging today. 2. If any of the pathology results indicate malignancy is present, bilateral breast MRI is recommended to determine extent of disease given the multiplicity of findings. I have discussed the findings and recommendations with the patient. If applicable, a reminder letter will be sent to the patient regarding the next appointment. BI-RADS CATEGORY  4: Suspicious. Electronically Signed: By: Frederico Hamman M.D. On: 02/04/2023 16:15       IMPRESSION/PLAN: 1. 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 Dr. Mitzi Hansen discusses the pathology findings and reviews the nature of noninvasive breast disease. She is in favor of breast conservation with lumpectomy provided her MRI does not show a different scenario. Dr. Mitzi Hansen recommends external radiotherapy to the breast  to reduce risks of local recurrence followed by antiestrogen therapy. We discussed the risks, benefits, short, and long term effects of radiotherapy, as well as the curative intent, and the patient is interested in proceeding. Dr. Mitzi Hansen discusses the delivery and logistics of radiotherapy and anticipates a course of 6 1/2 weeks  of radiotherapy to the right breast. We  will see her back a few weeks after surgery to discuss the simulation process and anticipate we starting radiotherapy about 4-6 weeks after surgery.  2. Possible genetic predisposition to malignancy. The patient is a candidate for genetic testing given her personal and family history. She will meet with our geneticist today in clinic.   In a visit lasting *** minutes, greater than 50% of the time was spent face to face reviewing her case, as well as in preparation of, discussing, and coordinating the patient's care.  The above documentation reflects my direct findings during this shared patient visit. Please see the separate note by Dr. Mitzi Hansen on this date for the remainder of the patient's plan of care.    Osker Mason, [facility]    **Disclaimer: This note was dictated with voice recognition software. Similar sounding words can inadvertently be transcribed and this note may contain transcription errors which may not have been corrected upon publication of note.**

## 2023-02-23 ENCOUNTER — Other Ambulatory Visit: Payer: Self-pay | Admitting: *Deleted

## 2023-02-23 DIAGNOSIS — D0511 Intraductal carcinoma in situ of right breast: Secondary | ICD-10-CM

## 2023-02-23 NOTE — Progress Notes (Signed)
New Breast Cancer Diagnosis: Right Breast UIQ  MRI Breast: Unscheduled at this time.  Histology per Pathology Report: High grade, DCIS  Receptor Status: ER(positive), PR (positive), Her2-neu (), Ki-(%)   Surgeon and surgical plan, if any:  Dr. Carolynne Edouard 02/11/2023   Medical oncologist, treatment if any:   Dr. Pamelia Hoit 03/05/2023  Family History of Breast/Ovarian/Prostate Cancer:   Lymphedema issues, if any:      Pain issues, if any:     SAFETY ISSUES: Prior radiation?  Pacemaker/ICD?  Possible current pregnancy? Having Cycles Is the patient on methotrexate?   Current Complaints / other details:

## 2023-02-25 ENCOUNTER — Encounter: Payer: Self-pay | Admitting: Radiation Oncology

## 2023-02-25 ENCOUNTER — Ambulatory Visit
Admission: RE | Admit: 2023-02-25 | Discharge: 2023-02-25 | Disposition: A | Discharge status: Home / Self Care | Payer: BC Managed Care – PPO | Source: Ambulatory Visit | Attending: Radiation Oncology | Admitting: Radiation Oncology

## 2023-02-25 ENCOUNTER — Telehealth: Payer: Self-pay | Admitting: Hematology and Oncology

## 2023-02-25 VITALS — BP 125/79 | HR 73 | Temp 97.8°F | Resp 18 | Ht 68.0 in | Wt 164.4 lb

## 2023-02-25 DIAGNOSIS — Z170 Estrogen receptor positive status [ER+]: Secondary | ICD-10-CM | POA: Diagnosis not present

## 2023-02-25 DIAGNOSIS — Z7952 Long term (current) use of systemic steroids: Secondary | ICD-10-CM | POA: Diagnosis not present

## 2023-02-25 DIAGNOSIS — J45909 Unspecified asthma, uncomplicated: Secondary | ICD-10-CM | POA: Diagnosis not present

## 2023-02-25 DIAGNOSIS — D0511 Intraductal carcinoma in situ of right breast: Secondary | ICD-10-CM | POA: Diagnosis present

## 2023-02-25 NOTE — Telephone Encounter (Signed)
Called patient to rescheduled 12/19 appt to 12/10 @2pm ... Patient confirmed rescheduled appt.

## 2023-03-01 ENCOUNTER — Other Ambulatory Visit: Payer: Self-pay | Admitting: Obstetrics and Gynecology

## 2023-03-01 DIAGNOSIS — R928 Other abnormal and inconclusive findings on diagnostic imaging of breast: Secondary | ICD-10-CM

## 2023-03-02 ENCOUNTER — Inpatient Hospital Stay: Payer: BC Managed Care – PPO

## 2023-03-02 ENCOUNTER — Inpatient Hospital Stay: Payer: BC Managed Care – PPO | Attending: Hematology and Oncology | Admitting: Genetic Counselor

## 2023-03-02 ENCOUNTER — Other Ambulatory Visit: Payer: Self-pay | Admitting: Genetic Counselor

## 2023-03-02 ENCOUNTER — Ambulatory Visit: Payer: Self-pay | Admitting: General Surgery

## 2023-03-02 DIAGNOSIS — D0511 Intraductal carcinoma in situ of right breast: Secondary | ICD-10-CM

## 2023-03-02 DIAGNOSIS — Z803 Family history of malignant neoplasm of breast: Secondary | ICD-10-CM | POA: Insufficient documentation

## 2023-03-02 DIAGNOSIS — Z800 Family history of malignant neoplasm of digestive organs: Secondary | ICD-10-CM | POA: Insufficient documentation

## 2023-03-02 LAB — GENETIC SCREENING ORDER

## 2023-03-02 MED ORDER — KETOROLAC TROMETHAMINE 15 MG/ML IJ SOLN: 15.0000 mg | INTRAMUSCULAR | Status: AC

## 2023-03-02 NOTE — Progress Notes (Signed)
REFERRING PROVIDER: Griselda Miner, MD [address] [place],  [place] [address]  PRIMARY PROVIDER:  Saralyn Pilar A, PA  PRIMARY REASON FOR VISIT:  1. Family history of breast cancer   2. Ductal carcinoma in situ (DCIS) of right breast      HISTORY OF PRESENT ILLNESS:   Miranda Davis, a 42 y.o. female, was seen for a [place] cancer genetics consultation at the request of Dr. Carolynne Edouard due to a personal and family history of breast cancer.  Miranda Davis presents to clinic today to discuss the possibility of a hereditary predisposition to cancer, genetic testing, and to further clarify her future cancer risks, as well as potential cancer risks for family members.   In November 2024, at the age of 39, Miranda Davis was diagnosed with DCIS of the breast. The treatment plan includes lumpectomy.    CANCER HISTORY:  Oncology History   No history exists.     RISK FACTORS:  Menarche was at age 74.  First live birth at age 52.  OCP use for approximately  19-20  years.  Ovaries intact: yes.  Hysterectomy: no.  Menopausal status: premenopausal.  HRT use: 0 years. Colonoscopy: no; not examined. Mammogram within the last year: yes. Number of breast biopsies: 1. Up to date with pelvic exams: yes. Any excessive radiation exposure in the past: no  Past Medical History:  Diagnosis Date   Asthma    Breast cancer (HCC) 02/2023   Complication of anesthesia    one side take of epidural   Family history of breast cancer    Hx of varicella    Pyelonephritis 03/23/2009   not during pregnancy    Past Surgical History:  Procedure Laterality Date   BREAST BIOPSY Right 02/09/2023   3 clips   BREAST BIOPSY Right 02/09/2023   [place] RT BREAST BX W LOC DEV 1ST LESION IMG BX SPEC US GUIDE 02/09/2023 [facility]   BREAST BIOPSY Right 02/09/2023   [place] RT BREAST BX W LOC DEV EA ADD LESION IMG BX SPEC US GUIDE 02/09/2023 [facility]   BREAST BIOPSY Right 02/09/2023   [place] RT  BREAST BX W LOC DEV EA ADD LESION IMG BX SPEC US GUIDE 02/09/2023 [facility]   EYE SURGERY  03/23/2006   NO PAST SURGERIES      Social History   Socioeconomic History   Marital status: Married    Spouse name: Not on file   Number of children: Not on file   Years of education: Not on file   Highest education level: Not on file  Occupational History   Not on file  Tobacco Use   Smoking status: Never    Passive exposure: Never   Smokeless tobacco: Never  Vaping Use   Vaping status: Never Used  Substance and Sexual Activity   Alcohol use: Yes    Alcohol/week: 3.0 standard drinks of alcohol    Types: 3 Glasses of wine per week   Drug use: No   Sexual activity: Yes  Other Topics Concern   Not on file  Social History Narrative   Not on file   Social Determinants of Health   Financial Resource Strain: Not on file  Food Insecurity: No Food Insecurity (02/25/2023)   Hunger Vital Sign    Worried About Running Out of Food in the Last Year: Never true    Ran Out of Food in the Last Year: Never true  Transportation Needs: No Transportation Needs (02/25/2023)  PRAPARE - Administrator, Civil Service (Medical): No    Lack of Transportation (Non-Medical): No  Physical Activity: Not on file  Stress: Not on file  Social Connections: Not on file     FAMILY HISTORY:  We obtained a detailed, 4-generation family history.  Significant diagnoses are listed below: Family History  Problem Relation Age of Onset   Healthy Mother    Healthy Father    Healthy Sister    Breast cancer Paternal Grandmother 44   Healthy Daughter    Healthy Son    Healthy Son    Healthy Half-Sister    Non-Hodgkin's lymphoma Maternal Great-grandmother    Stomach cancer Maternal Great-grandmother    Colon cancer Maternal Great-grandmother      The patient has three children who are cancer free.  She has a full sister and a paternal half sister who are cancer free.  Both parents are  living.  The patient's mother has one sister who is cancer free.  Both maternal grandparents are living and cancer free.  The grandmother's mother had brain, stomach, colon cancer and NHL, and the grandfathers mother had NHL.  The patient's father has four brothers, one sister and a paternal half sister all cancer free.  His mother had breast cancer at 47.  Miranda Davis is unaware of previous family history of genetic testing for hereditary cancer risks.  There is no reported Ashkenazi Jewish ancestry. There is no known consanguinity.  GENETIC COUNSELING ASSESSMENT: Miranda Davis is a 42 y.o. female with a personal and family history of breast cancer which is somewhat suggestive of a hereditary cancer syndrome and predisposition to cancer given her young age of onset and combination of cancer. We, therefore, discussed and recommended the following at today's visit.   DISCUSSION: We discussed that, in general, most cancer is not inherited in families, but instead is sporadic or familial. Sporadic cancers occur by chance and typically happen at older ages (>50 years) as this type of cancer is caused by genetic changes acquired during an individual's lifetime. Some families have more cancers than would be expected by chance; however, the ages or types of cancer are not consistent with a known genetic mutation or known genetic mutations have been ruled out. This type of familial cancer is thought to be due to a combination of multiple genetic, environmental, hormonal, and lifestyle factors. While this combination of factors likely increases the risk of cancer, the exact source of this risk is not currently identifiable or testable.  We discussed that 5 - 10% of breast cancer is hereditary, with most cases associated with BRCA mutations.  There are other genes that can be associated with hereditary breast cancer syndromes.  These include ATM, CHEK2 and PALB2.  We discussed that testing is beneficial for  several reasons including knowing how to follow individuals after completing their treatment, identifying whether potential treatment options such as PARP inhibitors would be beneficial, and understand if other family members could be at risk for cancer and allow them to undergo genetic testing.   We reviewed the characteristics, features and inheritance patterns of hereditary cancer syndromes. We also discussed genetic testing, including the appropriate family members to test, the process of testing, insurance coverage and turn-around-time for results. We discussed the implications of a negative, positive and/or variant of uncertain significant result. In order to get genetic test results in a timely manner so that Miranda Davis can use these genetic test results for surgical decisions, we recommended  Miranda Davis pursue genetic testing for the BRCAPlus. Once complete, we recommend Miranda Davis pursue reflex genetic testing to the CancerNext+RNAinsight gene panel.   The [facility] CancerNext+RNAinsight Panel includes sequencing, rearrangement analysis, and RNA analysis for the following 39 genes: APC, ATM, BAP1, BARD1, BMPR1A, BRCA1, BRCA2, BRIP1, CDH1, CDKN2A, CHEK2, FH, FLCN, MET, MLH1, MSH2, MSH6, MUTYH, NF1, NTHL1, PALB2, PMS2, PTEN, RAD51C, RAD51D, SMAD4, STK11, TP53, TSC1, TSC2, and VHL (sequencing and deletion/duplication); AXIN2, HOXB13, MBD4, MSH3, POLD1 and POLE (sequencing only); EPCAM and GREM1 (deletion/duplication only).   Based on Miranda Davis's personal and family history of cancer, she meets medical criteria for genetic testing. Despite that she meets criteria, she may still have an out of pocket cost.  We discussed that some people do not want to undergo genetic testing due to fear of genetic discrimination.  The Genetic Information Nondiscrimination Act (GINA) was signed into federal law in 2008. GINA prohibits health insurers and most employers from discriminating against individuals based on  genetic information (including the results of genetic tests and family history information). According to GINA, health insurance companies cannot consider genetic information to be a preexisting condition, nor can they use it to make decisions regarding coverage or rates. GINA also makes it illegal for most employers to use genetic information in making decisions about hiring, firing, promotion, or terms of employment. It is important to note that GINA does not offer protections for life insurance, disability insurance, or long-term care insurance. GINA does not apply to those in the [employer], those who work for companies with less than 15 employees, and new life insurance or long-term disability insurance policies.  Health status due to a cancer diagnosis is not protected under GINA. More information about GINA can be found by visiting [email or web address].   PLAN: After considering the risks, benefits, and limitations, Miranda Davis provided informed consent to pursue genetic testing and the blood sample was sent to [facility] for analysis of the CancerNext+RNAinsight. Results should be available within approximately 2-3 weeks' time, at which point they will be disclosed by telephone to Miranda Davis, as will any additional recommendations warranted by these results. Miranda Davis will receive a summary of her genetic counseling visit and a copy of her results once available. This information will also be available in Epic.   Lastly, we encouraged Miranda Davis to remain in contact with cancer genetics annually so that we can continuously update the family history and inform her of any changes in cancer genetics and testing that may be of benefit for this family.   Miranda Davis questions were answered to her satisfaction today. Our contact information was provided should additional questions or concerns arise. Thank you for the referral and allowing [place] to share in the care of your patient.    Lonie Rummell P. Lowell Guitar, MS, [facility] [employer], Patent attorney [email or web address] phone: [phone]  The patient was seen for a total of 35 minutes in face-to-face genetic counseling.  The patient brought her husband. Drs. Meliton Rattan, and/or [place] were available for questions, if needed..    _______________________________________________________________________ For Office Staff:  Number of people involved in session: 2 Was an Intern/ student involved with case: no

## 2023-03-03 ENCOUNTER — Ambulatory Visit
Admission: RE | Admit: 2023-03-03 | Discharge: 2023-03-03 | Disposition: A | Discharge status: Home / Self Care | Payer: BC Managed Care – PPO | Source: Ambulatory Visit | Attending: General Surgery | Admitting: General Surgery

## 2023-03-03 ENCOUNTER — Ambulatory Visit
Admission: RE | Admit: 2023-03-03 | Discharge: 2023-03-03 | Disposition: A | Discharge status: Home / Self Care | Payer: BC Managed Care – PPO | Source: Ambulatory Visit | Attending: Obstetrics and Gynecology | Admitting: Obstetrics and Gynecology

## 2023-03-03 ENCOUNTER — Other Ambulatory Visit: Payer: Self-pay | Admitting: Obstetrics and Gynecology

## 2023-03-03 ENCOUNTER — Inpatient Hospital Stay
Admission: RE | Admit: 2023-03-03 | Discharge: 2023-03-03 | Discharge status: Home / Self Care | Payer: BC Managed Care – PPO | Source: Ambulatory Visit | Attending: Obstetrics and Gynecology | Admitting: Obstetrics and Gynecology

## 2023-03-03 ENCOUNTER — Other Ambulatory Visit: Payer: Self-pay | Admitting: General Surgery

## 2023-03-03 DIAGNOSIS — N6310 Unspecified lump in the right breast, unspecified quadrant: Secondary | ICD-10-CM

## 2023-03-03 DIAGNOSIS — D0511 Intraductal carcinoma in situ of right breast: Secondary | ICD-10-CM

## 2023-03-03 DIAGNOSIS — R921 Mammographic calcification found on diagnostic imaging of breast: Secondary | ICD-10-CM

## 2023-03-03 DIAGNOSIS — R928 Other abnormal and inconclusive findings on diagnostic imaging of breast: Secondary | ICD-10-CM

## 2023-03-03 HISTORY — PX: BREAST BIOPSY: SHX20

## 2023-03-04 ENCOUNTER — Ambulatory Visit
Admission: RE | Admit: 2023-03-04 | Discharge: 2023-03-04 | Disposition: A | Discharge status: Home / Self Care | Payer: BC Managed Care – PPO | Source: Ambulatory Visit | Attending: General Surgery | Admitting: General Surgery

## 2023-03-04 ENCOUNTER — Other Ambulatory Visit: Payer: Self-pay | Admitting: General Surgery

## 2023-03-04 DIAGNOSIS — D0511 Intraductal carcinoma in situ of right breast: Secondary | ICD-10-CM

## 2023-03-04 HISTORY — PX: BREAST BIOPSY: SHX20

## 2023-03-04 LAB — SURGICAL PATHOLOGY

## 2023-03-04 NOTE — Progress Notes (Signed)
Surgical Instructions    Your procedure is scheduled on March 09, 2023.  Report to [facility] Main Entrance "A" at 11:45 A.M., then check in with the Admitting office.  Call this number if you have problems the morning of surgery:  [phone]  If you have any questions prior to your surgery date call [phone]: Open Monday-Friday 8am-4pm If you experience any cold or flu symptoms such as cough, fever, chills, shortness of breath, etc. between now and your scheduled surgery, please notify us at the above number.     Remember:  Do not eat after midnight the night before your surgery  You may drink clear liquids until 10:45 the morning of your surgery.   Clear liquids allowed are: Water, Non-Citrus Juices (without pulp), Carbonated Beverages, Clear Tea, Black Coffee Only (NO MILK, CREAM OR POWDERED CREAMER of any kind), and Gatorade.    Take these medicines the morning of surgery with A SIP OF WATER  EPINEPHrine (EPIPEN 2-PAK)  olopatadine (PATADAY)    As of today, STOP taking any Aspirin (unless otherwise instructed by your surgeon) Aleve, Naproxen, Ibuprofen, Motrin, Advil, Goody's, BC's, all herbal medications, fish oil, and all vitamins.                     Do NOT Smoke (Tobacco/Vaping) for 24 hours prior to your procedure.  If you use a CPAP at night, you may bring your mask/headgear for your overnight stay.   Contacts, glasses, piercing's, hearing aid's, dentures or partials may not be worn into surgery, please bring cases for these belongings.    For patients admitted to the hospital, discharge time will be determined by your treatment team.   Patients discharged the day of surgery will not be allowed to drive home, and someone needs to stay with them for 24 hours.  SURGICAL WAITING ROOM VISITATION Patients having surgery or a procedure may have no more than 2 support people in the waiting area - these visitors may rotate.   Children under the age of 23 must have an  adult with them who is not the patient. If the patient needs to stay at the hospital during part of their recovery, the visitor guidelines for inpatient rooms apply. Pre-op nurse will coordinate an appropriate time for 1 support person to accompany patient in pre-op.  This support person may not rotate.   Please refer to the [facility] website for the visitor guidelines for Inpatients (after your surgery is over and you are in a regular room).    Special instructions:   Miranda Davis- Preparing For Surgery  Before surgery, you can play an important role. Because skin is not sterile, your skin needs to be as free of germs as possible. You can reduce the number of germs on your skin by washing with CHG (chlorahexidine gluconate) Soap before surgery.  CHG is an antiseptic cleaner which kills germs and bonds with the skin to continue killing germs even after washing.    Oral Hygiene is also important to reduce your risk of infection.  Remember - BRUSH YOUR TEETH THE MORNING OF SURGERY WITH YOUR REGULAR TOOTHPASTE  Please do not use if you have an allergy to CHG or antibacterial soaps. If your skin becomes reddened/irritated stop using the CHG.  Do not shave (including legs and underarms) for at least 48 hours prior to first CHG shower. It is OK to shave your face.  Please follow these instructions carefully.   Shower the [employer] BEFORE SURGERY  and the MORNING OF SURGERY  If you chose to wash your hair, wash your hair first as usual with your normal shampoo.  After you shampoo, rinse your hair and body thoroughly to remove the shampoo.  Use CHG Soap as you would any other liquid soap. You can apply CHG directly to the skin and wash gently with a scrungie or a clean washcloth.   Apply the CHG Soap to your body ONLY FROM THE NECK DOWN.  Do not use on open wounds or open sores. Avoid contact with your eyes, ears, mouth and genitals (private parts). Wash Face and genitals (private parts)  with your  normal soap.   Wash thoroughly, paying special attention to the area where your surgery will be performed.  Thoroughly rinse your body with warm water from the neck down.  DO NOT shower/wash with your normal soap after using and rinsing off the CHG Soap.  Pat yourself dry with a CLEAN TOWEL.  Wear CLEAN PAJAMAS to bed the night before surgery  Place CLEAN SHEETS on your bed the night before your surgery  DO NOT SLEEP WITH PETS.   Day of Surgery: Take a shower with CHG soap. Do not wear jewelry or makeup Do not wear lotions, powders, perfumes/colognes, or deodorant. Do not shave 48 hours prior to surgery.  Men may shave face and neck. Do not bring valuables to the hospital.  [facility] is not responsible for any belongings or valuables. Do not wear nail polish, gel polish, artificial nails, or any other type of covering on natural nails (fingers and toes) If you have artificial nails or gel coating that need to be removed by a nail salon, please have this removed prior to surgery. Artificial nails or gel coating may interfere with anesthesia's ability to adequately monitor your vital signs. Wear Clean/Comfortable clothing the morning of surgery Remember to brush your teeth WITH YOUR REGULAR TOOTHPASTE.   Please read over the following fact sheets that you were given.    If you received a COVID test during your pre-op visit  it is requested that you wear a mask when out in public, stay away from anyone that may not be feeling well and notify your surgeon if you develop symptoms. If you have been in contact with anyone that has tested positive in the last 10 days please notify you surgeon.

## 2023-03-05 ENCOUNTER — Inpatient Hospital Stay (HOSPITAL_BASED_OUTPATIENT_CLINIC_OR_DEPARTMENT_OTHER): Payer: BC Managed Care – PPO | Admitting: Hematology and Oncology

## 2023-03-05 ENCOUNTER — Other Ambulatory Visit: Payer: Self-pay

## 2023-03-05 ENCOUNTER — Inpatient Hospital Stay: Payer: BC Managed Care – PPO

## 2023-03-05 ENCOUNTER — Encounter (HOSPITAL_COMMUNITY)
Admission: RE | Admit: 2023-03-05 | Discharge: 2023-03-05 | Disposition: A | Discharge status: Home / Self Care | Payer: BC Managed Care – PPO | Source: Ambulatory Visit | Attending: General Surgery | Admitting: General Surgery

## 2023-03-05 VITALS — BP 113/81 | HR 74 | Temp 97.9°F | Resp 17 | Ht 68.0 in | Wt 165.9 lb

## 2023-03-05 VITALS — BP 112/75 | HR 85 | Temp 98.2°F | Resp 18 | Ht 68.0 in | Wt 164.4 lb

## 2023-03-05 DIAGNOSIS — Z800 Family history of malignant neoplasm of digestive organs: Secondary | ICD-10-CM | POA: Diagnosis not present

## 2023-03-05 DIAGNOSIS — Z01812 Encounter for preprocedural laboratory examination: Secondary | ICD-10-CM | POA: Insufficient documentation

## 2023-03-05 DIAGNOSIS — D0511 Intraductal carcinoma in situ of right breast: Secondary | ICD-10-CM | POA: Diagnosis not present

## 2023-03-05 DIAGNOSIS — Z803 Family history of malignant neoplasm of breast: Secondary | ICD-10-CM | POA: Diagnosis not present

## 2023-03-05 DIAGNOSIS — Z01818 Encounter for other preprocedural examination: Secondary | ICD-10-CM

## 2023-03-05 LAB — CBC
HCT: 41.1 % (ref 36.0–46.0)
Hemoglobin: 14.1 g/dL (ref 12.0–15.0)
MCH: 31.9 pg (ref 26.0–34.0)
MCHC: 34.3 g/dL (ref 30.0–36.0)
MCV: 93 fL (ref 80.0–100.0)
Platelets: 229 10*3/uL (ref 150–400)
RBC: 4.42 MIL/uL (ref 3.87–5.11)
RDW: 11.8 % (ref 11.5–15.5)
WBC: 7.7 10*3/uL (ref 4.0–10.5)
nRBC: 0 % (ref 0.0–0.2)

## 2023-03-05 NOTE — Progress Notes (Signed)
PCP - Melida Quitter, PA as PCP  Cardiologist - denies  PPM/ICD - denies Device Orders - n/a Rep Notified - n/a  Chest x-ray - denies EKG - denies Stress Test - denies ECHO - denies Cardiac Cath - denies  Sleep Study - denies CPAP - n/a   DM- denies  Blood Thinner Instructions:denies Aspirin Instructions:na  ERAS Protcol -clear liquids until 10:45 PRE-SURGERY Ensure or G2-   COVID TEST- na   Anesthesia review: yes breast seed placement  Patient denies shortness of breath, fever, cough and chest pain at PAT appointment   All instructions explained to the patient, with a verbal understanding of the material. Patient agrees to go over the instructions while at home for a better understanding. Patient also instructed to self quarantine after being tested for COVID-19. The opportunity to ask questions was provided.

## 2023-03-05 NOTE — Assessment & Plan Note (Signed)
02/09/2023: Screening mammogram detected right breast asymmetry.  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 It is considered a precancerous lesion. DCIS is classified as a 0. It is generally detected through mammograms as calcifications. We discussed the significance of grades and its impact on prognosis. We also discussed the importance of ER and PR receptors and their implications to adjuvant treatment options. Prognosis of DCIS dependence on grade, comedo necrosis. It is anticipated that if not treated, 20-30% of DCIS can develop into invasive breast cancer.  Recommendation: 1. Breast conserving surgery 2. Followed by adjuvant radiation therapy 3. Followed by antiestrogen therapy with tamoxifen 5 years Genetic testing  Tamoxifen counseling: We discussed the risks and benefits of tamoxifen. These include but not limited to insomnia, hot flashes, mood changes, vaginal dryness, and weight gain. Although rare, serious side effects including endometrial cancer, risk of blood clots were also discussed. We strongly believe that the benefits far outweigh the risks. Patient understands these risks and consented to starting treatment. Planned treatment duration is 5 years.  Return to clinic after surgery to discuss the final pathology report and come up with an adjuvant treatment plan.

## 2023-03-05 NOTE — Progress Notes (Signed)
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 The patient reports that her paternal grandmother had breast cancer. The patient has already undergone genetic cancer testing earlier in the week. The patient's DCIS is reported to be approximately 1.3 cm in size, high grade, and positive for both estrogen and progesterone receptors. The patient's surgery is scheduled for the following week. The patient is currently not on any medication but has recently stopped taking birth control on her doctor's advice. The patient has three children and does not plan to have more.         I reviewed her records extensively and collaborated the history with the patient.  SUMMARY OF ONCOLOGIC HISTORY: Oncology History  Ductal carcinoma in situ (DCIS) of right breast  02/22/2023 Initial Diagnosis   Ductal carcinoma in situ (DCIS) of right breast   03/05/2023 Cancer Staging   Staging form: Breast, AJCC 8th Edition - Clinical stage from 03/05/2023: Stage 0 (cTis (DCIS), cN0, cM0, G3, ER+, PR+, HER2: Not Assessed) - Signed by Serena Croissant, MD on 03/05/2023 Stage prefix: Initial diagnosis Histologic grading system: 3 grade system      MEDICAL HISTORY:  Past Medical History:  Diagnosis Date   Asthma    Breast cancer (HCC) 02/2023   Complication of anesthesia    one side take of epidural   Family history of breast cancer    Hx of varicella    Pyelonephritis 03/23/2009   not during pregnancy    SURGICAL HISTORY: Past Surgical History:  Procedure Laterality Date    BREAST BIOPSY Right 02/09/2023   3 clips   BREAST BIOPSY Right 02/09/2023   [place] RT BREAST BX W LOC DEV 1ST LESION IMG BX SPEC US GUIDE 02/09/2023 [facility]   BREAST BIOPSY Right 02/09/2023   [place] RT BREAST BX W LOC DEV EA ADD LESION IMG BX SPEC US GUIDE 02/09/2023 [facility]   BREAST BIOPSY Right 02/09/2023   [place] RT BREAST BX W LOC DEV EA ADD LESION IMG BX SPEC US GUIDE 02/09/2023 [facility]   BREAST BIOPSY Right 03/03/2023   [place] RT BREAST BX W LOC DEV 1ST LESION IMG BX SPEC US GUIDE 03/03/2023 [facility]   BREAST BIOPSY  03/04/2023   MM RT RADIOACTIVE SEED LOC MAMMO GUIDE 03/04/2023 [facility]   EYE SURGERY  03/23/2006   NO PAST SURGERIES      SOCIAL HISTORY: Social History   Socioeconomic History   Marital status: Married    Spouse name: Not on file   Number of children: Not on file   Years of education: Not on file   Highest education level: Not on file  Occupational History   Not on file  Tobacco Use   Smoking status: Never    Passive exposure: Never   Smokeless tobacco: Never  Vaping Use   Vaping status: Never Used  Substance and Sexual Activity   Alcohol use: Yes    Alcohol/week: 3.0 standard drinks of alcohol    Types: 3 Glasses of wine per week   Drug use: No   Sexual activity: Yes  Other Topics  Concern   Not on file  Social History Narrative   Not on file   Social Drivers of Health   Financial Resource Strain: Not on file  Food Insecurity: No Food Insecurity (02/25/2023)   Hunger Vital Sign    Worried About Running Out of Food in the Last Year: Never true    Ran Out of Food in the Last Year: Never true  Transportation Needs: No Transportation Needs (02/25/2023)   PRAPARE - Administrator, Civil Service (Medical): No    Lack of Transportation (Non-Medical): No  Physical Activity: Not on file  Stress: Not on file  Social Connections: Not on file  Intimate Partner Violence: Not At Risk (02/25/2023)    Humiliation, Afraid, Rape, and Kick questionnaire    Fear of Current or Ex-Partner: No    Emotionally Abused: No    Physically Abused: No    Sexually Abused: No    FAMILY HISTORY: Family History  Problem Relation Age of Onset   Healthy Mother    Healthy Father    Healthy Sister    Breast cancer Paternal Grandmother 50   Healthy Daughter    Healthy Son    Healthy Son    Healthy Half-Sister    Non-Hodgkin's lymphoma Maternal Great-grandmother    Stomach cancer Maternal Great-grandmother    Colon cancer Maternal Great-grandmother     ALLERGIES:  is allergic to bee venom and wasp venom.  MEDICATIONS:  Current Outpatient Medications  Medication Sig Dispense Refill   diphenhydrAMINE (BENADRYL ALLERGY) 25 mg capsule Take 1 capsule (25 mg total) by mouth every 8 (eight) hours as needed for allergies or itching. (Patient not taking: Reported on 03/04/2023) 30 capsule 0   EPINEPHrine (EPIPEN 2-PAK) 0.3 mg/0.3 mL IJ SOAJ injection Inject 0.3 mg into the muscle as needed for anaphylaxis. 1 each 0   hydrocortisone cream 1 % Apply 1 Application topically daily.     ibuprofen (ADVIL) 200 MG tablet Take 400 mg by mouth every 6 (six) hours as needed for moderate pain (pain score 4-6) or headache.     ibuprofen (ADVIL,MOTRIN) 600 MG tablet Take 1 tablet (600 mg total) by mouth every 6 (six) hours as needed. (Patient not taking: Reported on 03/04/2023) 60 tablet 3   olopatadine (PATADAY) 0.1 % ophthalmic solution Place 1 drop into both eyes 2 (two) times daily as needed for allergies.     No current facility-administered medications for this visit.    REVIEW OF SYSTEMS:   Constitutional: Denies fevers, chills or abnormal night sweats Breast:  Denies any palpable lumps or discharge All other systems were reviewed with the patient and are negative.  PHYSICAL EXAMINATION: ECOG PERFORMANCE STATUS: 0 - Asymptomatic  Vitals:   03/05/23 1202  BP: 112/75  Pulse: 85  Resp: 18  Temp: 98.2 F  (36.8 C)  SpO2: 100%   Filed Weights   03/05/23 1202  Weight: 164 lb 6.4 oz (74.6 kg)    GENERAL:alert, no distress and comfortable    LABORATORY DATA:  I have reviewed the data as listed Lab Results  Component Value Date   WBC 6.1 01/07/2023   HGB 15.2 01/07/2023   HCT 45.1 01/07/2023   MCV 95 01/07/2023   PLT 221 01/07/2023   Lab Results  Component Value Date   NA 142 01/07/2023   K 4.8 01/07/2023   CL 106 01/07/2023   CO2 22 01/07/2023    RADIOGRAPHIC STUDIES: I have personally reviewed the radiological reports and agreed  with the findings in the report.  ASSESSMENT AND PLAN:  Ductal carcinoma in situ (DCIS) of right breast 02/09/2023: Screening mammogram detected right breast asymmetry.  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 It is considered a precancerous lesion. DCIS is classified as a 0. It is generally detected through mammograms as calcifications. We discussed the significance of grades and its impact on prognosis. We also discussed the importance of ER and PR receptors and their implications to adjuvant treatment options. Prognosis of DCIS dependence on grade, comedo necrosis. It is anticipated that if not treated, 20-30% of DCIS can develop into invasive breast cancer.  Recommendation: 1. Breast conserving surgery 2. Followed by adjuvant radiation therapy 3. Followed by antiestrogen therapy with tamoxifen 5 years Genetic testing  Tamoxifen counseling: We discussed the risks and benefits of tamoxifen. These include but not limited to insomnia, hot flashes, mood changes, vaginal dryness, and weight gain. Although rare, serious side effects including endometrial cancer, risk of blood clots were also discussed. We strongly believe that the benefits far outweigh the risks. Patient understands  these risks and consented to starting treatment. Planned treatment duration is 5 years.  Return to clinic after surgery to discuss the final pathology report and come up with an adjuvant treatment plan.  Assessment and Plan    Ductal Carcinoma In Situ (DCIS) High grade, ER/PR positive, size approximately 1.3 cm. Lumpectomy scheduled for 03/09/2023. Discussed the nature of DCIS, its potential to transform into invasive cancer, and the importance of surgery. - Proceed with lumpectomy on 03/09/2023. - Plan for radiation therapy approximately one month post-surgery. - Consider low-dose Tamoxifen after radiation to reduce recurrence risk.  Contraception Patient recently stopped birth control. Discussed potential options considering the upcoming Tamoxifen therapy. - Consult with gynecologist to discuss suitable contraception options, including Mirena, copper IUD, or tubal ligation.  Follow-up Plan to review pathology post-surgery and discuss further steps in treatment. - Telephone follow-up on 03/16/2023 to discuss pathology results. - In-person follow-up after completion of radiation therapy.      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   All questions were answered. The patient knows to call the clinic with any problems, questions or concerns.    Tamsen Meek, MD 03/05/23

## 2023-03-05 NOTE — Progress Notes (Addendum)
POCT urine obtained at PAT appointment urine not obtained prior to seed placement POCT urine negative

## 2023-03-08 ENCOUNTER — Encounter: Payer: Self-pay | Admitting: *Deleted

## 2023-03-08 DIAGNOSIS — D0511 Intraductal carcinoma in situ of right breast: Secondary | ICD-10-CM

## 2023-03-08 LAB — POCT PREGNANCY, URINE: Preg Test, Ur: NEGATIVE

## 2023-03-09 ENCOUNTER — Encounter (HOSPITAL_COMMUNITY)
Admission: RE | Disposition: A | Discharge status: Home / Self Care | Payer: Self-pay | Source: Home / Self Care | Attending: General Surgery

## 2023-03-09 ENCOUNTER — Other Ambulatory Visit: Payer: Self-pay

## 2023-03-09 ENCOUNTER — Ambulatory Visit (HOSPITAL_COMMUNITY): Payer: Self-pay | Admitting: Physician Assistant

## 2023-03-09 ENCOUNTER — Ambulatory Visit
Admission: RE | Admit: 2023-03-09 | Discharge: 2023-03-09 | Disposition: A | Discharge status: Home / Self Care | Payer: BC Managed Care – PPO | Source: Ambulatory Visit | Attending: General Surgery | Admitting: General Surgery

## 2023-03-09 ENCOUNTER — Encounter (HOSPITAL_COMMUNITY): Payer: Self-pay | Admitting: General Surgery

## 2023-03-09 ENCOUNTER — Encounter: Payer: Self-pay | Admitting: Genetic Counselor

## 2023-03-09 ENCOUNTER — Ambulatory Visit (HOSPITAL_COMMUNITY): Payer: BC Managed Care – PPO | Admitting: Anesthesiology

## 2023-03-09 ENCOUNTER — Telehealth: Payer: Self-pay | Admitting: Genetic Counselor

## 2023-03-09 ENCOUNTER — Ambulatory Visit (HOSPITAL_COMMUNITY)
Admission: RE | Admit: 2023-03-09 | Discharge: 2023-03-09 | Disposition: A | Discharge status: Home / Self Care | Payer: BC Managed Care – PPO | Attending: General Surgery | Admitting: General Surgery

## 2023-03-09 DIAGNOSIS — Z803 Family history of malignant neoplasm of breast: Secondary | ICD-10-CM | POA: Insufficient documentation

## 2023-03-09 DIAGNOSIS — D0511 Intraductal carcinoma in situ of right breast: Secondary | ICD-10-CM

## 2023-03-09 DIAGNOSIS — Z170 Estrogen receptor positive status [ER+]: Secondary | ICD-10-CM | POA: Diagnosis not present

## 2023-03-09 DIAGNOSIS — Z1721 Progesterone receptor positive status: Secondary | ICD-10-CM | POA: Diagnosis not present

## 2023-03-09 DIAGNOSIS — Z1379 Encounter for other screening for genetic and chromosomal anomalies: Secondary | ICD-10-CM | POA: Insufficient documentation

## 2023-03-09 HISTORY — PX: BREAST LUMPECTOMY WITH RADIOACTIVE SEED LOCALIZATION: SHX6424

## 2023-03-09 SURGERY — BREAST LUMPECTOMY WITH RADIOACTIVE SEED LOCALIZATION
Anesthesia: General | Site: Breast | Laterality: Right

## 2023-03-09 MED ORDER — PROPOFOL 10 MG/ML IV BOLUS
INTRAVENOUS | Status: AC
  Filled 2023-03-09: qty 20

## 2023-03-09 MED ORDER — 0.9 % SODIUM CHLORIDE (POUR BTL) OPTIME
TOPICAL | Status: DC | PRN
  Administered 2023-03-09: 1000 mL

## 2023-03-09 MED ORDER — ORAL CARE MOUTH RINSE: 15.0000 mL | Freq: Once | OROMUCOSAL | Status: AC

## 2023-03-09 MED ORDER — CHLORHEXIDINE GLUCONATE 0.12 % MT SOLN
15.0000 mL | Freq: Once | OROMUCOSAL | Status: AC
  Administered 2023-03-09: 15 mL via OROMUCOSAL

## 2023-03-09 MED ORDER — AMISULPRIDE (ANTIEMETIC) 5 MG/2ML IV SOLN: 10.0000 mg | Freq: Once | INTRAVENOUS | Status: DC | PRN

## 2023-03-09 MED ORDER — ACETAMINOPHEN 500 MG PO TABS: 1000.0000 mg | ORAL_TABLET | ORAL | Status: DC

## 2023-03-09 MED ORDER — HYDROMORPHONE HCL 1 MG/ML IJ SOLN
0.2500 mg | INTRAMUSCULAR | Status: DC | PRN
  Administered 2023-03-09: 0.5 mg via INTRAVENOUS

## 2023-03-09 MED ORDER — GABAPENTIN 100 MG PO CAPS
100.0000 mg | ORAL_CAPSULE | ORAL | Status: AC
  Administered 2023-03-09: 100 mg via ORAL
  Filled 2023-03-09: qty 1

## 2023-03-09 MED ORDER — PHENYLEPHRINE HCL (PRESSORS) 10 MG/ML IV SOLN
INTRAVENOUS | Status: AC
  Filled 2023-03-09: qty 1

## 2023-03-09 MED ORDER — FENTANYL CITRATE (PF) 250 MCG/5ML IJ SOLN
INTRAMUSCULAR | Status: AC
  Filled 2023-03-09: qty 5

## 2023-03-09 MED ORDER — OXYCODONE HCL 5 MG/5ML PO SOLN: 5.0000 mg | Freq: Once | ORAL | Status: DC | PRN

## 2023-03-09 MED ORDER — OXYCODONE HCL 5 MG PO TABS: 5.0000 mg | ORAL_TABLET | Freq: Four times a day (QID) | ORAL | 0 refills | Status: DC | PRN

## 2023-03-09 MED ORDER — ORAL CARE MOUTH RINSE: 15.0000 mL | Freq: Once | OROMUCOSAL | Status: DC

## 2023-03-09 MED ORDER — SODIUM CHLORIDE 0.9 % IV SOLN: INTRAVENOUS | Status: DC | PRN

## 2023-03-09 MED ORDER — MEPERIDINE HCL 25 MG/ML IJ SOLN: 6.2500 mg | INTRAMUSCULAR | Status: DC | PRN

## 2023-03-09 MED ORDER — PHENYLEPHRINE HCL-NACL 20-0.9 MG/250ML-% IV SOLN
INTRAVENOUS | Status: DC | PRN
  Administered 2023-03-09: 20 ug/min via INTRAVENOUS

## 2023-03-09 MED ORDER — OXYCODONE HCL 5 MG PO TABS: 5.0000 mg | ORAL_TABLET | Freq: Once | ORAL | Status: DC | PRN

## 2023-03-09 MED ORDER — CHLORHEXIDINE GLUCONATE CLOTH 2 % EX PADS: 6.0000 | MEDICATED_PAD | Freq: Once | CUTANEOUS | Status: DC

## 2023-03-09 MED ORDER — KETOROLAC TROMETHAMINE 30 MG/ML IJ SOLN
INTRAMUSCULAR | Status: AC
  Filled 2023-03-09: qty 1

## 2023-03-09 MED ORDER — BUPIVACAINE-EPINEPHRINE (PF) 0.25% -1:200000 IJ SOLN
INTRAMUSCULAR | Status: AC
  Filled 2023-03-09: qty 30

## 2023-03-09 MED ORDER — ONDANSETRON HCL 4 MG/2ML IJ SOLN: 4.0000 mg | Freq: Once | INTRAMUSCULAR | Status: DC | PRN

## 2023-03-09 MED ORDER — LIDOCAINE 2% (20 MG/ML) 5 ML SYRINGE
INTRAMUSCULAR | Status: DC | PRN
  Administered 2023-03-09: 60 mg via INTRAVENOUS

## 2023-03-09 MED ORDER — SODIUM CHLORIDE 0.9 % IV SOLN: INTRAVENOUS | Status: DC

## 2023-03-09 MED ORDER — CHLORHEXIDINE GLUCONATE 0.12 % MT SOLN
15.0000 mL | Freq: Once | OROMUCOSAL | Status: DC
  Filled 2023-03-09: qty 15

## 2023-03-09 MED ORDER — BUPIVACAINE-EPINEPHRINE 0.25% -1:200000 IJ SOLN
INTRAMUSCULAR | Status: DC | PRN
  Administered 2023-03-09: 20 mL

## 2023-03-09 MED ORDER — HYDROMORPHONE HCL 1 MG/ML IJ SOLN
INTRAMUSCULAR | Status: AC
  Filled 2023-03-09: qty 1

## 2023-03-09 MED ORDER — CEFAZOLIN SODIUM-DEXTROSE 2-4 GM/100ML-% IV SOLN
INTRAVENOUS | Status: AC
  Filled 2023-03-09: qty 100

## 2023-03-09 MED ORDER — ONDANSETRON HCL 4 MG/2ML IJ SOLN
INTRAMUSCULAR | Status: DC | PRN
  Administered 2023-03-09: 4 mg via INTRAVENOUS

## 2023-03-09 MED ORDER — CEFAZOLIN SODIUM-DEXTROSE 2-4 GM/100ML-% IV SOLN
2.0000 g | INTRAVENOUS | Status: AC
  Administered 2023-03-09: 2 g via INTRAVENOUS

## 2023-03-09 MED ORDER — ACETAMINOPHEN 500 MG PO TABS
1000.0000 mg | ORAL_TABLET | Freq: Once | ORAL | Status: AC
  Administered 2023-03-09: 1000 mg via ORAL
  Filled 2023-03-09: qty 2

## 2023-03-09 MED ORDER — FENTANYL CITRATE (PF) 250 MCG/5ML IJ SOLN
INTRAMUSCULAR | Status: DC | PRN
  Administered 2023-03-09 (×2): 50 ug via INTRAVENOUS

## 2023-03-09 MED ORDER — PROPOFOL 10 MG/ML IV BOLUS
INTRAVENOUS | Status: DC | PRN
  Administered 2023-03-09: 200 mg via INTRAVENOUS

## 2023-03-09 MED ORDER — DEXAMETHASONE SODIUM PHOSPHATE 10 MG/ML IJ SOLN
INTRAMUSCULAR | Status: DC | PRN
  Administered 2023-03-09: 10 mg via INTRAVENOUS

## 2023-03-09 MED ORDER — KETOROLAC TROMETHAMINE 30 MG/ML IJ SOLN
30.0000 mg | Freq: Once | INTRAMUSCULAR | Status: AC | PRN
  Administered 2023-03-09: 30 mg via INTRAVENOUS

## 2023-03-09 MED ORDER — PROPOFOL 10 MG/ML IV BOLUS
INTRAVENOUS | Status: AC
Start: 2023-03-09 — End: ?
  Filled 2023-03-09: qty 20

## 2023-03-09 SURGICAL SUPPLY — 30 items
APPLIER CLIP 9.375 MED OPEN (MISCELLANEOUS) ×1
BAG COUNTER SPONGE SURGICOUNT (BAG) IMPLANT
BINDER BREAST LRG (GAUZE/BANDAGES/DRESSINGS) IMPLANT
BINDER BREAST XLRG (GAUZE/BANDAGES/DRESSINGS) IMPLANT
CANISTER SUCT 3000ML PPV (MISCELLANEOUS) ×1 IMPLANT
CHLORAPREP W/TINT 26 (MISCELLANEOUS) ×1 IMPLANT
CLIP APPLIE 9.375 MED OPEN (MISCELLANEOUS) IMPLANT
COVER PROBE W GEL 5X96 (DRAPES) ×1 IMPLANT
COVER SURGICAL LIGHT HANDLE (MISCELLANEOUS) ×1 IMPLANT
DERMABOND ADVANCED .7 DNX12 (GAUZE/BANDAGES/DRESSINGS) ×1 IMPLANT
DEVICE DUBIN SPECIMEN MAMMOGRA (MISCELLANEOUS) ×1 IMPLANT
DRAPE CHEST BREAST 15X10 FENES (DRAPES) ×1 IMPLANT
ELECT COATED BLADE 2.86 ST (ELECTRODE) ×1 IMPLANT
ELECT REM PT RETURN 9FT ADLT (ELECTROSURGICAL) ×1
ELECTRODE REM PT RTRN 9FT ADLT (ELECTROSURGICAL) ×1 IMPLANT
GLOVE BIO SURGEON STRL SZ7.5 (GLOVE) ×2 IMPLANT
GOWN STRL REUS W/ TWL LRG LVL3 (GOWN DISPOSABLE) ×2 IMPLANT
KIT BASIN OR (CUSTOM PROCEDURE TRAY) ×1 IMPLANT
KIT MARKER MARGIN INK (KITS) ×1 IMPLANT
LIGHT WAVEGUIDE WIDE FLAT (MISCELLANEOUS) IMPLANT
NDL HYPO 25GX1X1/2 BEV (NEEDLE) ×1 IMPLANT
NEEDLE HYPO 25GX1X1/2 BEV (NEEDLE) ×1
NS IRRIG 1000ML POUR BTL (IV SOLUTION) ×1 IMPLANT
PACK GENERAL/GYN (CUSTOM PROCEDURE TRAY) ×1 IMPLANT
SUT MNCRL AB 4-0 PS2 18 (SUTURE) ×1 IMPLANT
SUT SILK 2 0 SH (SUTURE) IMPLANT
SUT VIC AB 3-0 SH 18 (SUTURE) ×1 IMPLANT
SYR CONTROL 10ML LL (SYRINGE) ×1 IMPLANT
TOWEL GREEN STERILE (TOWEL DISPOSABLE) ×1 IMPLANT
TOWEL GREEN STERILE FF (TOWEL DISPOSABLE) ×1 IMPLANT

## 2023-03-09 NOTE — H&P (Signed)
REFERRING PHYSICIAN: Waldon Reining, MD PROVIDER: Lindell Noe, MD MRN: [id number] DOB: [birth year] Subjective   Chief Complaint: New Consultation and Breast Cancer  History of Present Illness: Miranda Davis is a 42 y.o. female who is seen today as an office consultation for evaluation of New Consultation and Breast Cancer  We are asked to see the patient in consultation by Dr. Waynard Reeds to evaluate her for a new right breast cancer. The patient is a 42 year old white female who recently went for her first mammogram. At that time she was found to have several findings in the upper and upper inner quadrant of the right breast. There were 1.3 cm of calcification in the upper inner quadrant. There was a 1.3 cm mass in the upper inner quadrant. There was also a 1.5 cm mass in the upper portion of the right breast. The axilla looked normal. These 3 areas were biopsied. The calcifications came back as ductal carcinoma in situ that was ER and PR positive. The other 2 areas came back benign. There was a fourth area mentioned in the upper outer quadrant that has not been looked at yet. She is otherwise in good health and does not smoke. She does have a family history of breast cancer in a paternal grandmother.  Review of Systems: A complete review of systems was obtained from the patient. I have reviewed this information and discussed as appropriate with the patient. See HPI as well for other ROS.  ROS   Medical History: Past Medical History:  Diagnosis Date  History of cancer   Patient Active Problem List  Diagnosis  Ductal carcinoma in situ (DCIS) of right breast   History reviewed. No pertinent surgical history.   No Known Allergies  Current Outpatient Medications on File Prior to Visit  Medication Sig Dispense Refill  norgestrel-ethinyl estradioL (CRYSELLE, 28,) 0.3-30 mg-mcg tablet Take 1 tablet by mouth once daily   No current facility-administered medications on file prior  to visit.   History reviewed. No pertinent family history.   Social History   Tobacco Use  Smoking Status Never  Smokeless Tobacco Never    Social History   Socioeconomic History  Marital status: Married  Tobacco Use  Smoking status: Never  Smokeless tobacco: Never  Substance and Sexual Activity  Alcohol use: Not Currently  Drug use: Never   Objective:   Vitals:  BP: 123/80  Pulse: 84  Temp: 36.7 C (98 F)  Weight: 75.8 kg (167 lb)   There is no height or weight on file to calculate BMI.  Physical Exam Vitals reviewed.  Constitutional:  General: She is not in acute distress. Appearance: Normal appearance.  HENT:  Head: Normocephalic and atraumatic.  Right Ear: External ear normal.  Left Ear: External ear normal.  Nose: Nose normal.  Mouth/Throat:  Mouth: Mucous membranes are moist.  Pharynx: Oropharynx is clear.  Eyes:  General: No scleral icterus. Extraocular Movements: Extraocular movements intact.  Conjunctiva/sclera: Conjunctivae normal.  Pupils: Pupils are equal, round, and reactive to light.  Cardiovascular:  Rate and Rhythm: Normal rate and regular rhythm.  Pulses: Normal pulses.  Heart sounds: Normal heart sounds.  Pulmonary:  Effort: Pulmonary effort is normal. No respiratory distress.  Breath sounds: Normal breath sounds.  Abdominal:  General: Bowel sounds are normal.  Palpations: Abdomen is soft.  Tenderness: There is no abdominal tenderness.  Musculoskeletal:  General: No swelling, tenderness or deformity. Normal range of motion.  Cervical back: Normal range of motion and neck supple.  Skin: General: Skin is warm and dry.  Coloration: Skin is not jaundiced.  Neurological:  General: No focal deficit present.  Mental Status: She is alert and oriented to person, place, and time.  Psychiatric:  Mood and Affect: Mood normal.  Behavior: Behavior normal.     Breast: There is a small palpable bruise in the upper inner quadrant of the  right breast. Other than this there is no palpable mass in either breast. There is no palpable axillary, supraclavicular, or cervical lymphadenopathy  Labs, Imaging and Diagnostic Testing:  Assessment and Plan:   Diagnoses and all orders for this visit:  Ductal carcinoma in situ (DCIS) of right breast - Ambulatory Referral to Oncology-Medical - Ambulatory Referral to Radiation Oncology - [facility] Case Posting Request; Future - US breast limited right; Future - MRI breast bilateral with and without contrast; Future   The patient appears to have a 1.3 cm area of ductal carcinoma in situ in the upper inner quadrant of the right breast. I have discussed with her in detail the different options for treatment and at this point given the information we have she would likely be a good candidate and favor breast conservation. She would not need a node evaluation. At this point we will ask for a second look ultrasound and an MRI to better delineate the size of the area involved. We also need to know if the findings that were benign were concordant from radiology. We will order the MRI and second look ultrasound and wait for those results. In the meantime we will go ahead and refer her to medical and radiation oncology to discuss adjuvant therapy.

## 2023-03-09 NOTE — Anesthesia Postprocedure Evaluation (Signed)
Anesthesia Post Note  Patient: Dannial Monarch  Procedure(s) Performed: RIGHT BREAST RADIOACTIVE SEED LOCALIZED LUMPECTOMY (Right: Breast)     Patient location during evaluation: PACU Anesthesia Type: General Level of consciousness: awake and alert, oriented and patient cooperative Pain management: pain level controlled Vital Signs Assessment: post-procedure vital signs reviewed and stable Respiratory status: spontaneous breathing, nonlabored ventilation and respiratory function stable Cardiovascular status: blood pressure returned to baseline and stable Postop Assessment: no apparent nausea or vomiting Anesthetic complications: no   No notable events documented.  Last Vitals:  Vitals:   03/09/23 1530 03/09/23 1545  BP: 116/75 120/81  Pulse: 71 66  Resp: 15 18  Temp:    SpO2: 97% 99%    Last Pain:  Vitals:   03/09/23 1515  TempSrc:   PainSc: 6                  Lannie Fields

## 2023-03-09 NOTE — Telephone Encounter (Signed)
Negative genetic testing on the BRCAPlus panel.  The remainder of testing is pending and we will call when it is completed.

## 2023-03-09 NOTE — Anesthesia Procedure Notes (Signed)
Procedure Name: LMA Insertion Date/Time: 03/09/2023 2:17 PM  Performed by: Allyn Kenner, CRNAPre-anesthesia Checklist: Patient identified, Emergency Drugs available, Suction available and Patient being monitored Patient Re-evaluated:Patient Re-evaluated prior to induction Oxygen Delivery Method: Circle System Utilized Preoxygenation: Pre-oxygenation with 100% oxygen Induction Type: IV induction Ventilation: Mask ventilation without difficulty LMA: LMA inserted LMA Size: 4.0 Number of attempts: 1 Airway Equipment and Method: Bite block Placement Confirmation: positive ETCO2 Tube secured with: Tape Dental Injury: Teeth and Oropharynx as per pre-operative assessment

## 2023-03-09 NOTE — Op Note (Signed)
03/09/2023  3:04 PM  PATIENT:  Miranda Davis  42 y.o. female  PRE-OPERATIVE DIAGNOSIS:  RIGHT BREAST DUCTAL CARCINOMA IN SITU  POST-OPERATIVE DIAGNOSIS:  RIGHT BREAST DUCTAL CARCINOMA IN SITU  PROCEDURE:  Procedure(s): RIGHT BREAST RADIOACTIVE SEED LOCALIZED LUMPECTOMY (Right)  SURGEON:  Surgeons and Role:    * Griselda Miner, MD - Primary  PHYSICIAN ASSISTANT:   ASSISTANTS: none   ANESTHESIA:   local and general  EBL:  minimal   BLOOD ADMINISTERED:none  DRAINS: none   LOCAL MEDICATIONS USED:  MARCAINE     SPECIMEN:  Source of Specimen:  right breast tissue with additional superior and medial margins  DISPOSITION OF SPECIMEN:  PATHOLOGY  COUNTS:  YES  TOURNIQUET:  * No tourniquets in log *  DICTATION: .Dragon Dictation  After informed consent was obtained the patient was brought to the operating room and placed in the supine position on the operating table.  After adequate induction of general anesthesia the patient's right breast was prepped with ChloraPrep, allowed to dry, and draped in usual sterile manner.  An appropriate timeout was performed.  Previously an I-125 seed was placed in the upper inner quadrant of the right breast to mark an area of ductal carcinoma in situ.  The neoprobe was set to I-125 in the area of radioactivity was readily identified.  The area around this was infiltrated with quarter percent Marcaine.  A curvilinear incision was made along the upper inner edge of the areola of the right breast with a 15 blade knife.  The incision was carried through the skin and subcutaneous tissue sharply with the electrocautery.  Dissection was carried throughout the upper inner quadrant between the breast tissue and the subcutaneous fat and skin.  Once this dissection was well beyond the area of the cancer I then removed a circular portion of breast tissue sharply with the electrocautery around the radioactive seed while checking the area of radioactivity  frequently.  This dissection was carried all the way to the muscle of the chest wall.  Once the tissue was removed it was oriented with the appropriate paint colors.  A specimen radiograph was obtained that showed the clip and seed to be near the center of the specimen.  I did elect to take an additional medial and superior margin and these were marked appropriately.  All of the tissue was then sent to pathology for further evaluation.  Hemostasis was achieved using the Bovie electrocautery.  The wound was irrigated with saline and infiltrated with more quarter percent Marcaine.  The deep layer of the incision was closed with interrupted 3-0 Vicryl stitches.  The skin was then closed with interrupted 4-0 Monocryl subcuticular stitches.  Dermabond dressings were applied.  The patient tolerated the procedure well.  At the end of the case all needle sponge and instrument counts were correct.  The patient was then awakened and taken to recovery in stable condition.  PLAN OF CARE: Discharge to home after PACU  PATIENT DISPOSITION:  PACU - hemodynamically stable.   Delay start of Pharmacological VTE agent (>24hrs) due to surgical blood loss or risk of bleeding: not applicable

## 2023-03-09 NOTE — Transfer of Care (Signed)
Immediate Anesthesia Transfer of Care Note  Patient: Miranda Davis  Procedure(s) Performed: RIGHT BREAST RADIOACTIVE SEED LOCALIZED LUMPECTOMY (Right: Breast)  Patient Location: PACU  Anesthesia Type:General  Level of Consciousness: awake, drowsy, and patient cooperative  Airway & Oxygen Therapy: Patient Spontanous Breathing  Post-op Assessment: Report given to RN and Post -op Vital signs reviewed and stable  Post vital signs: Reviewed and stable  Last Vitals:  Vitals Value Taken Time  BP 124/86 03/09/23 1515  Temp 36.6 C 03/09/23 1512  Pulse 74 03/09/23 1517  Resp 12 03/09/23 1517  SpO2 100 % 03/09/23 1517  Vitals shown include unfiled device data.  Last Pain:  Vitals:   03/09/23 1209  TempSrc:   PainSc: 2          Complications: No notable events documented.

## 2023-03-09 NOTE — Anesthesia Preprocedure Evaluation (Addendum)
Anesthesia Evaluation  Patient identified by MRN, date of birth, ID band Patient awake    Reviewed: Allergy & Precautions, NPO status , Patient's Chart, lab work & pertinent test results  History of Anesthesia Complications Negative for: history of anesthetic complications  Airway Mallampati: I  TM Distance: >3 FB Neck ROM: Full    Dental  (+) Teeth Intact, Dental Advisory Given   Pulmonary asthma (childhood)    Pulmonary exam normal breath sounds clear to auscultation       Cardiovascular negative cardio ROS Normal cardiovascular exam Rhythm:Regular Rate:Normal     Neuro/Psych negative neurological ROS  negative psych ROS   GI/Hepatic negative GI ROS, Neg liver ROS,,,  Endo/Other  negative endocrine ROS    Renal/GU negative Renal ROS  negative genitourinary   Musculoskeletal negative musculoskeletal ROS (+)    Abdominal   Peds  Hematology negative hematology ROS (+) Hb 14   Anesthesia Other Findings   Reproductive/Obstetrics negative OB ROS                             Anesthesia Physical Anesthesia Plan  ASA: 2  Anesthesia Plan: General   Post-op Pain Management: Tylenol PO (pre-op)* and Toradol IV (intra-op)*   Induction: Intravenous  PONV Risk Score and Plan: 4 or greater and Ondansetron, Dexamethasone, Midazolam and Treatment may vary due to age or medical condition  Airway Management Planned: LMA  Additional Equipment: None  Intra-op Plan:   Post-operative Plan: Extubation in OR  Informed Consent: I have reviewed the patients History and Physical, chart, labs and discussed the procedure including the risks, benefits and alternatives for the proposed anesthesia with the patient or authorized representative who has indicated his/her understanding and acceptance.     Dental advisory given  Plan Discussed with: CRNA  Anesthesia Plan Comments:        Anesthesia  Quick Evaluation

## 2023-03-09 NOTE — Interval H&P Note (Signed)
History and Physical Interval Note:  03/09/2023 1:53 PM  Miranda Davis  has presented today for surgery, with the diagnosis of RIGHT BREAST DUCTAL CARCINOMA IN SITU.  The various methods of treatment have been discussed with the patient and family. After consideration of risks, benefits and other options for treatment, the patient has consented to  Procedure(s): RIGHT BREAST RADIOACTIVE SEED LOCALIZED LUMPECTOMY (Right) as a surgical intervention.  The patient's history has been reviewed, patient examined, no change in status, stable for surgery.  I have reviewed the patient's chart and labs.  Questions were answered to the patient's satisfaction.     [name]

## 2023-03-10 ENCOUNTER — Encounter (HOSPITAL_COMMUNITY): Payer: Self-pay | Admitting: General Surgery

## 2023-03-11 ENCOUNTER — Encounter: Payer: BC Managed Care – PPO | Admitting: Genetic Counselor

## 2023-03-11 ENCOUNTER — Other Ambulatory Visit: Payer: BC Managed Care – PPO

## 2023-03-11 ENCOUNTER — Telehealth: Payer: Self-pay | Admitting: Genetic Counselor

## 2023-03-11 NOTE — Telephone Encounter (Signed)
LM on VM that results are back and to please call.  Left CB instructions. 

## 2023-03-12 ENCOUNTER — Ambulatory Visit: Payer: Self-pay | Admitting: Genetic Counselor

## 2023-03-12 DIAGNOSIS — Z1379 Encounter for other screening for genetic and chromosomal anomalies: Secondary | ICD-10-CM

## 2023-03-12 NOTE — Progress Notes (Signed)
HPI:  [name] was previously seen in the [facility] due to a personal and family history of breast cancer and concerns regarding a hereditary predisposition to cancer. Please refer to our prior cancer genetics clinic note for more information regarding our discussion, assessment and recommendations, at the time. Miranda Davis recent genetic test results were disclosed to her, as were recommendations warranted by these results. These results and recommendations are discussed in more detail below.  CANCER HISTORY:  Oncology History  Ductal carcinoma in situ (DCIS) of right breast  02/22/2023 Initial Diagnosis   Ductal carcinoma in situ (DCIS) of right breast   03/05/2023 Cancer Staging   Staging form: Breast, AJCC 8th Edition - Clinical stage from 03/05/2023: Stage 0 (cTis (DCIS), cN0, cM0, G3, ER+, PR+, HER2: Not Assessed) - Signed by Serena Croissant, MD on 03/05/2023 Stage prefix: Initial diagnosis Histologic grading system: 3 grade system   03/09/2023 Genetic Testing   Negative genetic testing on the BRCAPlus panel and CancerNext-RNAinsight.  The report date is 03/09/2023 and 03/11/2023.   The BRCAPlus gene panel offered by [facility] and includes sequencing and rearrangement analysis for the following 13 genes: ATM, BARD1, BRCA1, BRCA2, CDH1, CHEK2, NF1, PALB2, PTEN, RAD51C, RAD51D, STK11 and TP53.  The [facility] CancerNext+RNAinsight Panel includes sequencing, rearrangement analysis, and RNA analysis for the following 39 genes: APC, ATM, BAP1, BARD1, BMPR1A, BRCA1, BRCA2, BRIP1, CDH1, CDKN2A, CHEK2, FH, FLCN, MET, MLH1, MSH2, MSH6, MUTYH, NF1, NTHL1, PALB2, PMS2, PTEN, RAD51C, RAD51D, SMAD4, STK11, TP53, TSC1, TSC2, and VHL (sequencing and deletion/duplication); AXIN2, HOXB13, MBD4, MSH3, POLD1 and POLE (sequencing only); EPCAM and GREM1 (deletion/duplication only).      FAMILY HISTORY:  We obtained a detailed, 4-generation family history.  Significant diagnoses  are listed below: Family History  Problem Relation Age of Onset   Healthy Mother    Healthy Father    Healthy Sister    Breast cancer Paternal Grandmother 20   Healthy Daughter    Healthy Son    Healthy Son    Healthy Half-Sister    Non-Hodgkin's lymphoma Maternal Great-grandmother    Stomach cancer Maternal Great-grandmother    Colon cancer Maternal Great-grandmother        The patient has three children who are cancer free.  She has a full sister and a paternal half sister who are cancer free.  Both parents are living.   The patient's mother has one sister who is cancer free.  Both maternal grandparents are living and cancer free.  The grandmother's mother had brain, stomach, colon cancer and NHL, and the grandfathers mother had NHL.   The patient's father has four brothers, one sister and a paternal half sister all cancer free.  His mother had breast cancer at 53.   Miranda Davis is unaware of previous family history of genetic testing for hereditary cancer risks.  There is no reported Ashkenazi Jewish ancestry. There is no known consanguinity.  GENETIC TEST RESULTS: Genetic testing reported out on March 10, 2023 through the CancerNext+RNAinsight panel cancer panel found no pathogenic mutations. The [facility] CancerNext+RNAinsight Panel includes sequencing, rearrangement analysis, and RNA analysis for the following 39 genes: APC, ATM, BAP1, BARD1, BMPR1A, BRCA1, BRCA2, BRIP1, CDH1, CDKN2A, CHEK2, FH, FLCN, MET, MLH1, MSH2, MSH6, MUTYH, NF1, NTHL1, PALB2, PMS2, PTEN, RAD51C, RAD51D, SMAD4, STK11, TP53, TSC1, TSC2, and VHL (sequencing and deletion/duplication); AXIN2, HOXB13, MBD4, MSH3, POLD1 and POLE (sequencing only); EPCAM and GREM1 (deletion/duplication only). The test report has been scanned into EPIC and  is located under the Molecular Pathology section of the Results Review tab.  A portion of the result report is included below for reference.     We discussed with Miranda Davis  that because current genetic testing is not perfect, it is possible there may be a gene mutation in one of these genes that current testing cannot detect, but that chance is small.  We also discussed, that there could be another gene that has not yet been discovered, or that we have not yet tested, that is responsible for the cancer diagnoses in the family. It is also possible there is a hereditary cause for the cancer in the family that Miranda Davis did not inherit and therefore was not identified in her testing.  Therefore, it is important to remain in touch with cancer genetics in the future so that we can continue to offer Miranda Davis the most up to date genetic testing.   ADDITIONAL GENETIC TESTING: We discussed with Miranda Davis that her genetic testing was fairly extensive.  If there are genes identified to increase cancer risk that can be analyzed in the future, we would be happy to discuss and coordinate this testing at that time.    CANCER SCREENING RECOMMENDATIONS: Miranda Davis test result is considered negative (normal).  This means that we have not identified a hereditary cause for her personal and family history of breast cancer at this time. Most cancers happen by chance and this negative test suggests that her personal and family history of cancer may fall into this category.    Possible reasons for Miranda Davis's negative genetic test include:  1. There may be a gene mutation in one of these genes that current testing methods cannot detect but that chance is small.  2. There could be another gene that has not yet been discovered, or that we have not yet tested, that is responsible for the cancer diagnoses in the family.  3.  There may be no hereditary risk for cancer in the family. The cancers in Miranda Davis and/or her family may be sporadic/familial or due to other genetic and environmental factors. 4. It is also possible there is a hereditary cause for the cancer in the family that  Miranda Davis did not inherit.  Therefore, it is recommended she continue to follow the cancer management and screening guidelines provided by her oncology and primary healthcare provider. An individual's cancer risk and medical management are not determined by genetic test results alone. Overall cancer risk assessment incorporates additional factors, including personal medical history, family history, and any available genetic information that may result in a personalized plan for cancer prevention and surveillance  RECOMMENDATIONS FOR FAMILY MEMBERS:  Individuals in this family might be at some increased risk of developing cancer, over the general population risk, simply due to the family history of cancer.  We recommended women in this family have a yearly mammogram beginning at age 79, or 14 years younger than the earliest onset of cancer, an annual clinical breast exam, and perform monthly breast self-exams. Women in this family should also have a gynecological exam as recommended by their primary provider. All family members should be referred for colonoscopy starting at age 6, or 28 years younger than the earliest onset of cancer.  FOLLOW-UP: Lastly, we discussed with Miranda Davis that cancer genetics is a rapidly advancing field and it is possible that new genetic tests will be appropriate for her and/or her family members in the future. We encouraged her  to remain in contact with cancer genetics on an annual basis so we can update her personal and family histories and let her know of advances in cancer genetics that may benefit this family.   Our contact number was provided. Miranda Davis questions were answered to her satisfaction, and she knows she is welcome to call us at anytime with additional questions or concerns.   Maylon Cos, MS, [facility] Licensed, Certified Genetic Counselor [email or web address]

## 2023-03-12 NOTE — Telephone Encounter (Signed)
Revealed negative genetic testing.  Discussed that we do not know why she has breast cancer or why there is cancer in the family. It could be due to a different gene that we are not testing, or maybe our current technology may not be able to pick something up.  It will be important for her to keep in contact with genetics to keep up with whether additional testing may be needed. 

## 2023-03-16 LAB — SURGICAL PATHOLOGY

## 2023-03-23 ENCOUNTER — Encounter: Payer: Self-pay | Admitting: *Deleted

## 2023-03-28 ENCOUNTER — Telehealth: Payer: 59 | Admitting: Physician Assistant

## 2023-03-28 DIAGNOSIS — B001 Herpesviral vesicular dermatitis: Secondary | ICD-10-CM

## 2023-03-28 MED ORDER — VALACYCLOVIR HCL 500 MG PO TABS: 1000.0000 mg | ORAL_TABLET | Freq: Two times a day (BID) | ORAL | 0 refills | Status: AC

## 2023-03-28 NOTE — Patient Instructions (Signed)
 Miranda Davis, thank you for joining Kirk GORMAN Sage, PA-C for today's virtual visit.  While this provider is not your primary care provider (PCP), if your PCP is located in our provider database this encounter information will be shared with them immediately following your visit.   A [facility] MyChart account gives you access to today's visit and all your visits, tests, and labs performed at [facility]  click here if you don't have a [facility] MyChart account or go to [email or web address]  Consent: (Patient) Miranda Davis provided verbal consent for this virtual visit at the beginning of the encounter.  Current Medications:  Current Outpatient Medications:    valACYclovir  (VALTREX ) 500 MG tablet, Take 2 tablets (1,000 mg total) by mouth 2 (two) times daily for 3 days., Disp: 12 tablet, Rfl: 0   oxyCODONE  (ROXICODONE ) 5 MG immediate release tablet, Take 1 tablet (5 mg total) by mouth every 6 (six) hours as needed for severe pain (pain score 7-10)., Disp: 10 tablet, Rfl: 0   Medications ordered in this encounter:  Meds ordered this encounter  Medications   valACYclovir  (VALTREX ) 500 MG tablet    Sig: Take 2 tablets (1,000 mg total) by mouth 2 (two) times daily for 3 days.    Dispense:  12 tablet    Refill:  0    Supervising Provider:   BLAISE ALEENE KIDD [[id number]]     *If you need refills on other medications prior to your next appointment, please contact your pharmacy*  Follow-Up: Call back or seek an in-person evaluation if the symptoms worsen or if the condition fails to improve as anticipated.  [facility] [phone]  Other Instructions Cold Sore  A cold sore, also called a fever blister, is a small, fluid-filled sore that forms inside the mouth or on the lips, gums, nose, chin, or cheeks. Cold sores can spread to other parts of the body, such as the eyes, fingers, or genitals. Cold sores can spread from person to person (are  contagious) until the sores crust over completely. Most cold sores go away within 2 weeks. What are the causes? Cold sores are caused by an infection from a common type of herpes simplex virus (HSV-1). HSV-1 is closely related to the HSV-2virus, which is the virus that causes genital herpes, but these viruses are not the same. Once a person is infected with HSV-1, the virus remains permanently in the body. HSV-1 is spread from person to person through close contact, such as through kissing, touching the affected area, or sharing personal items such as lip balm, razors, a drinking glass, or eating utensils. What increases the risk? You are more likely to develop this condition if you: Are tired, stressed, or sick. Are menstruating. Are pregnant. Take certain medicines. Are exposed to cold weather or too much sun. What are the signs or symptoms? Symptoms of a cold sore outbreak go through different stages. These are the stages of a cold sore: Tingling, itching, or burning is felt 1-2 days before the outbreak. Fluid-filled blisters appear on the lips, inside the mouth, on the nose, or on the cheeks. The blisters start to ooze clear fluid. The blisters dry up, and a yellow crust appears in their place. The crust falls off. In some cases, other symptoms can develop during a cold sore outbreak. These can include: Fever. Sore throat. Headache. Muscle aches. Swollen neck glands. How is this diagnosed? This condition is diagnosed based on your medical history and a  physical exam. Your health care provider may do a blood test or may swab some fluid from your sore and then examine the swab in the lab. How is this treated? There is no cure for cold sores or HSV-1. There is also no vaccine for HSV-1. Most cold sores go away on their own without treatment within 2 weeks. Medicines cannot make the infection go away, but your health care provider may prescribe medicines to: Help relieve some of the pain  associated with the sores. Work to stop the virus from multiplying. Shorten healing time. Medicines may be in the form of creams, gels, pills, or a shot. Follow these instructions at home: Medicines Take or apply over-the-counter and prescription medicines only as told by your health care provider. Use a cotton-tip swab to apply creams or gels to your sores. Ask your health care provider if you can take lysine supplements. Research has found that lysine may help heal the cold sore faster and prevent outbreaks. Sore care  Do not touch the sores or pick the scabs. Wash your hands often with soap and water for at least 20 seconds. Do not touch your eyes without washing your hands first. Keep the sores clean and dry. If directed, put ice on the sores. To do this: Put ice in a plastic bag. Place a towel between your skin and the bag. Leave the ice on for 20 minutes, 2-3 times a day. Remove the ice if your skin turns bright red. This is very important. If you cannot feel pain, heat, or cold, you have a greater risk of damage to the area. Eating and drinking Eat a soft, bland diet. Avoid eating hot, cold, or salty foods. Use a straw if it hurts to drink out of a glass. Eat foods that are rich in lysine, such as meat, fish, and dairy products. Avoid sugary foods, chocolates, nuts, and grains. These foods are rich in a nutrient called arginine, which can cause the virus to multiply. Lifestyle Do not kiss, have oral sex, or share personal items until your sores heal. Stress, poor sleep, and being out in the sun can trigger outbreaks. Make sure you: Do activities that help you relax, such as deep breathing exercises or meditation. Get enough sleep. Apply sunscreen on your lips before you go out in the sun. Contact a health care provider if: You have symptoms for more than 2 weeks. You have pus coming from the sores. You have redness that is spreading. You have pain or irritation in your  eye. You get sores on your genitals. Your sores do not heal within 2 weeks. You have frequent cold sore outbreaks. Get help right away if: You have a fever and your symptoms suddenly get worse. You have a headache and confusion. You have tiredness (fatigue) or loss of appetite. You have a stiff neck or sensitivity to light. Summary A cold sore, also called a fever blister, is a small, fluid-filled sore that forms inside the mouth or on the lips, gums, nose, chin, or cheeks. Most cold sores go away on their own without treatment within 2 weeks. Your health care provider may prescribe medicines to help relieve some of the pain, work to stop the virus from multiplying, and shorten healing time. Wash your hands often with soap and water for at least 20 seconds. Do not touch your eyes without washing your hands first. Do not kiss, have oral sex, or share personal items until your sores heal. Contact a health care  provider if your sores do not heal within 2 weeks. This information is not intended to replace advice given to you by your health care provider. Make sure you discuss any questions you have with your health care provider. Document Revised: 12/18/2020 Document Reviewed: 12/18/2020 Elsevier Patient Education  2024 Elsevier Inc.    If you have been instructed to have an in-person evaluation today at a local Urgent Care facility, please use the link below. It will take you to a list of all of our available [facility], including address, phone number and hours of operation. Please do not delay care.  [facility]  If you or a family member do not have a primary care provider, use the link below to schedule a visit and establish care. When you choose a [facility] primary care physician or advanced practice provider, you gain a long-term partner in health. Find a Primary Care Provider  Learn more about [facility]'s in-office and virtual care options: [place]  - Get Care Now

## 2023-03-28 NOTE — Progress Notes (Signed)
 Virtual Visit Consent   Tinnie VEAR Bolus, you are scheduled for a virtual visit with a [facility] provider today. Just as with appointments in the office, your consent must be obtained to participate. Your consent will be active for this visit and any virtual visit you may have with one of our providers in the next 365 days. If you have a MyChart account, a copy of this consent can be sent to you electronically.  As this is a virtual visit, video technology does not allow for your provider to perform a traditional examination. This may limit your provider's ability to fully assess your condition. If your provider identifies any concerns that need to be evaluated in person or the need to arrange testing (such as labs, EKG, etc.), we will make arrangements to do so. Although advances in technology are sophisticated, we cannot ensure that it will always work on either your end or our end. If the connection with a video visit is poor, the visit may have to be switched to a telephone visit. With either a video or telephone visit, we are not always able to ensure that we have a secure connection.  By engaging in this virtual visit, you consent to the provision of healthcare and authorize for your insurance to be billed (if applicable) for the services provided during this visit. Depending on your insurance coverage, you may receive a charge related to this service.  I need to obtain your verbal consent now. Are you willing to proceed with your visit today? Tinnie VEAR Bolus has provided verbal consent on 03/28/2023 for a virtual visit (video or telephone). Kirk GORMAN Sage, PA-C  Date: 03/28/2023 2:52 PM  Virtual Visit via Video Note   I, Shaiden Aldous S Mayers, connected with  KETA VANVALKENBURGH  ([id number], May 25, 1980) on 03/28/23 at  2:45 PM EST by a video-enabled telemedicine application and verified that I am speaking with the correct person using two identifiers.  Location: Patient: Virtual Visit Location  Patient: Home Provider: Virtual Visit Location Provider: Home Office   I discussed the limitations of evaluation and management by telemedicine and the availability of in person appointments. The patient expressed understanding and agreed to proceed.    History of Present Illness: Miranda Davis is a 43 y.o. who identifies as a female who was assigned female at birth, and is being seen today for a new onset of a cold sore. They noticed a tingling sensation around their lips while out shopping  They sought over-the-counter relief with Abreva  which provided little relief.   Problems:  Patient Active Problem List   Diagnosis Date Noted   Genetic testing 03/09/2023   Family history of breast cancer    Ductal carcinoma in situ (DCIS) of right breast 02/22/2023   Vitamin D  deficiency 09/07/2017   Hemorrhoids 09/07/2017   Eczema 03/09/2017    Allergies:  Allergies  Allergen Reactions   Bee Venom Anaphylaxis   Wasp Venom Anaphylaxis   Medications:  Current Outpatient Medications:    valACYclovir  (VALTREX ) 500 MG tablet, Take 2 tablets (1,000 mg total) by mouth 2 (two) times daily for 3 days., Disp: 12 tablet, Rfl: 0   oxyCODONE  (ROXICODONE ) 5 MG immediate release tablet, Take 1 tablet (5 mg total) by mouth every 6 (six) hours as needed for severe pain (pain score 7-10)., Disp: 10 tablet, Rfl: 0  Observations/Objective: Patient is well-developed, well-nourished in no acute distress.  Resting comfortably  at home.  Head is normocephalic, atraumatic.  No  labored breathing.  Speech is clear and coherent with logical content.  Patient is alert and oriented at baseline.    Assessment and Plan: 1. Cold sore (Primary) - valACYclovir  (VALTREX ) 500 MG tablet; Take 2 tablets (1,000 mg total) by mouth 2 (two) times daily for 3 days.  Dispense: 12 tablet; Refill: 0   -Prescribe Valtrex  500mg , two tablets twice daily for three days. -Continue using Abreva for symptomatic relief.  Follow  Up Instructions: I discussed the assessment and treatment plan with the patient. The patient was provided an opportunity to ask questions and all were answered. The patient agreed with the plan and demonstrated an understanding of the instructions.  A copy of instructions were sent to the patient via MyChart unless otherwise noted below.     The patient was advised to call back or seek an in-person evaluation if the symptoms worsen or if the condition fails to improve as anticipated.    Kirk RAMAN Mayers, PA-C

## 2023-04-05 ENCOUNTER — Encounter: Payer: Self-pay | Admitting: Radiation Oncology

## 2023-04-05 ENCOUNTER — Ambulatory Visit
Admission: RE | Admit: 2023-04-05 | Discharge: 2023-04-05 | Disposition: A | Discharge status: Home / Self Care | Payer: 59 | Source: Ambulatory Visit | Attending: Radiation Oncology | Admitting: Radiation Oncology

## 2023-04-05 DIAGNOSIS — D0511 Intraductal carcinoma in situ of right breast: Secondary | ICD-10-CM

## 2023-04-05 NOTE — Progress Notes (Signed)
 Nursing interview for Ductal carcinoma in situ (DCIS) of right breast.   Patient identity verified x2.  Patient reports occasional RT breast tenderness 2/10. Patient denies chest/ arm pain and any other related issues at this time.  Meaningful use complete.  No pregnancy. Last menstrual 03/21/2023. Last pregnancy test- Negative on 03/05/2023.  Patient will take an at-home HCG on 04/06/2023 in the early Am hrs and report the results directly to me for documentation.   Vitals- None-Visit via telephone  This concludes the interaction.  Rosaline Minerva, LPN

## 2023-04-05 NOTE — Progress Notes (Signed)
 Radiation Oncology         [phone] ________________________________  Outpatient Follow Up- Conducted via telephone at patient request.  I spoke with the patient to conduct this  visit via telephone. The patient was notified in advance and was offered an in person or telemedicine meeting to allow for face to face communication but instead preferred to proceed with a telephone visit.   Name: Miranda Davis        MRN: [id number]  Date of Service: 04/05/2023 DOB: Aug 11, 1980  RR:Zidumnf, Joesph LABOR, PA  Odean Potts, MD     REFERRING PHYSICIAN: Odean Potts, MD   DIAGNOSIS: The encounter diagnosis was Ductal carcinoma in situ (DCIS) of right breast.   HISTORY OF PRESENT ILLNESS: Miranda Davis is a 43 y.o. female seen in the multidisciplinary breast clinic for a new diagnosis of right breast cancer. The patient was noted to have screening detected abnormality with calcifications in the right breast.  She returned for diagnostic workup on 02/04/2023 which showed persistent asymmetry with associated coarse scattered genus calcifications spanning 1.3 cm inferior to this was an oval mass measuring 1 cm.  By ultrasound, this was in the 2 o'clock position measuring 1.3 cm and also approximately a centimeter away was a near anechoic oval mass with indistinct margins which has an extending tail from it altogether this measured 2.3 cm.  In the 12 o'clock position there was a 1.5 cm mass as well.  Multiple normal-appearing right axillary lymph nodes were appreciated.  An addendum indicated that there was also findings on the screening mammogram in the upper outer aspect of the breast that were not fully evaluated. Biopsies on 02/09/2023 showed high-grade DCIS in the 2:00 right breast specimen with calcifications and necrosis, the second 2:00 specimen 4 cm from nipple with the heart clip was consistent with fibrocystic change, and the 12:00 spiral clip position showed benign breast parenchyma with  no specific pathologic changes.  The DCIS was ER/PR positive.     Since her last visit, the patient underwent further diagnostic workup of her right breast, the focal asymmetry showed persistence with mammogram on 03/03/2023, and by ultrasound in the 9:30 position a complex cystic mass measuring up to 1.3 cm was noted.  She underwent biopsy of this on 03/03/2023 which fortunately showed fibrocystic change and no malignancy.  She then proceeded with right lumpectomy on 03/09/2023.  This showed high-grade DCIS with necrosis the largest dimension was 1 cm microscopic extension into sections between clip sites estimated overall 1.6 cm.  Her margins were negative, the closest was at 2 mm inferiorly additional medial and superior margin were negative.  She is seen to discuss adjuvant radiation.   PREVIOUS RADIATION THERAPY: No   PAST MEDICAL HISTORY:  Past Medical History:  Diagnosis Date   Asthma    Breast cancer (HCC) 02/2023   Complication of anesthesia    one side take of epidural   Family history of breast cancer    Hx of varicella    Pyelonephritis 03/23/2009   not during pregnancy       PAST SURGICAL HISTORY: Past Surgical History:  Procedure Laterality Date   BREAST BIOPSY Right 02/09/2023   3 clips   BREAST BIOPSY Right 02/09/2023   US  RT BREAST BX W LOC DEV 1ST LESION IMG BX SPEC US  GUIDE 02/09/2023 [facility]   BREAST BIOPSY Right 02/09/2023   US  RT BREAST BX W LOC DEV EA ADD LESION IMG BX SPEC US  GUIDE 02/09/2023  [facility]   BREAST BIOPSY Right 02/09/2023   US  RT BREAST BX W LOC DEV EA ADD LESION IMG BX SPEC US  GUIDE 02/09/2023 [facility]   BREAST BIOPSY Right 03/03/2023   US  RT BREAST BX W LOC DEV 1ST LESION IMG BX SPEC US  GUIDE 03/03/2023 [facility]   BREAST BIOPSY  03/04/2023   MM RT RADIOACTIVE SEED LOC MAMMO GUIDE 03/04/2023 [facility]   BREAST LUMPECTOMY WITH RADIOACTIVE SEED LOCALIZATION Right 03/09/2023   Procedure: RIGHT  BREAST RADIOACTIVE SEED LOCALIZED LUMPECTOMY;  Surgeon: Curvin Deward MOULD, MD;  Location: [facility] OR;  Service: General;  Laterality: Right;   EYE SURGERY  03/23/2006   NO PAST SURGERIES       FAMILY HISTORY:  Family History  Problem Relation Age of Onset   Healthy Mother    Healthy Father    Healthy Sister    Breast cancer Paternal Grandmother 25   Healthy Daughter    Healthy Son    Healthy Son    Healthy Half-Sister    Non-Hodgkin's lymphoma Maternal Great-grandmother    Stomach cancer Maternal Great-grandmother    Colon cancer Maternal Great-grandmother      SOCIAL HISTORY:  reports that she has never smoked. She has never been exposed to tobacco smoke. She has never used smokeless tobacco. She reports current alcohol use of about 3.0 standard drinks of alcohol per week. She reports that she does not use drugs.  The patient is married and lives in [place].  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 She has 2 sons who are teenagers and a daughter who is 8. 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   ALLERGIES: Bee venom and Wasp venom   MEDICATIONS:  Current Outpatient Medications  Medication Sig Dispense Refill   oxyCODONE  (ROXICODONE ) 5 MG immediate release tablet Take 1 tablet (5 mg total) by mouth every 6 (six) hours as needed for severe pain (pain score 7-10). (Patient not taking: Reported on 04/05/2023) 10 tablet 0   No current facility-administered medications for this encounter.     REVIEW OF SYSTEMS: On review of systems, the patient reports that she is doing well since surgery. She has some palpable fullness that his tender but no breast swelling, fevers, or frank pain. No other complaints are verbalized.      PHYSICAL EXAM:  Unable to assess due to encounter type    ECOG = 1  0 - Asymptomatic (Fully active, able to carry on all predisease activities without restriction)  1 - Symptomatic  but completely ambulatory (Restricted in physically strenuous activity but ambulatory and able to carry out work of a light or sedentary nature. For example, light housework, office work)  2 - Symptomatic, <50% in bed during the day (Ambulatory and capable of all self care but unable to carry out any work activities. Up and about more than 50% of waking hours)  3 - Symptomatic, >50% in bed, but not bedbound (Capable of only limited self-care, confined to bed or chair 50% or more of waking hours)  4 - Bedbound (Completely disabled. Cannot carry on any self-care. Totally confined to bed or chair)  5 - Death   Raylene MM, Creech RH, Tormey DC, et al. [phone]). Toxicity and response criteria of the [facility]. Am. DOROTHA Bridges. Oncol. 5 (6): 649-55    LABORATORY DATA:  Lab Results  Component Value Date   WBC 7.7 03/05/2023  HGB 14.1 03/05/2023   HCT 41.1 03/05/2023   MCV 93.0 03/05/2023   PLT 229 03/05/2023   Lab Results  Component Value Date   NA 142 01/07/2023   K 4.8 01/07/2023   CL 106 01/07/2023   CO2 22 01/07/2023   Lab Results  Component Value Date   ALT 37 (H) 01/07/2023   AST 32 01/07/2023   ALKPHOS 69 01/07/2023   BILITOT 0.6 01/07/2023      RADIOGRAPHY: MM Breast Surgical Specimen Result Date: 03/09/2023 CLINICAL DATA:  Post lumpectomy specimen radiograph EXAM: SPECIMEN RADIOGRAPH OF THE RIGHT BREAST COMPARISON:  Previous exam(s). FINDINGS: Status post excision of the right breast. The radioactive seed and coil and heart biopsy marker clips are present, completely intact, and were marked for pathology. IMPRESSION: Specimen radiograph of the right breast. These results were called by telephone at the time of interpretation on 03/09/2023 at 2:47 pm to provider DEWARD NULL III , who verbally acknowledged these results. Electronically Signed   By: Inocente Ast M.D.   On: 03/09/2023 14:47       IMPRESSION/PLAN: 1. High grade ER/PR positive DCIS of  the right breast. Dr. Dewey has reviewed the patient's final pathology results.  Today we reviewed the nature of noninvasive breast disease.  She has done well since surgery, and appears to be ready now to now proceed with adjuvant treatment.  Dr. Dewey recommends external radiotherapy to the breast  to reduce risks of local recurrence.  She will also meet back with Dr. Odean to review the role of antiestrogen therapy. We discussed the risks, benefits, short, and long term effects of radiotherapy, as well as the curative intent, and the patient is interested in proceeding. I reviewed the delivery and logistics of radiotherapy and we discussed the differences in radiation approach. We confirmed with her insurance prior to our visit, and at the conclusion, she elects for 6 1/2 weeks of treatment since her insurance does not require prior authorization. She will come on Wednesday for simulation at which time she will sign written consent to proceed.  2.   Contraceptive Counseling. The patient is aware of the need to avoid pregnancy prior to and during radiation. She is going to take a home pregnancy test in the morning prior to simulation. She will report this testing to simulation staff.   This encounter was conducted via telephone.  The patient has provided two factor identification and has given verbal consent for this type of encounter and has been advised to only accept a meeting of this type in a secure network environment. The time spent during this encounter was 35 minutes including preparation, discussion, and coordination of the patient's care. The attendants for this meeting include   Donald Estefana Husband  and Tinnie VEAR Bolus.  During the encounter, Donald Estefana Husband was located at [facility] in the Radiation Oncology Department. Tinnie VEAR Bolus was located at home.     Donald KYM Husband, [facility]    **Disclaimer: This note was dictated with voice recognition software.  Similar sounding words can inadvertently be transcribed and this note may contain transcription errors which may not have been corrected upon publication of note.**

## 2023-04-06 ENCOUNTER — Telehealth: Payer: Self-pay

## 2023-04-06 NOTE — Telephone Encounter (Signed)
 Patient identity verified x2.  Patient took an at-home HCG (pregnancy) test today 04/06/2023.  Results- NEGATIVE  Currently NO CHANCES OF PREGNANCY.  This concludes the interaction.  Ruel Favors, LPN

## 2023-04-07 ENCOUNTER — Ambulatory Visit: Payer: Self-pay

## 2023-04-07 ENCOUNTER — Ambulatory Visit
Admission: RE | Admit: 2023-04-07 | Discharge: 2023-04-07 | Disposition: A | Discharge status: Home / Self Care | Payer: 59 | Source: Ambulatory Visit | Attending: Radiation Oncology | Admitting: Radiation Oncology

## 2023-04-07 ENCOUNTER — Ambulatory Visit: Payer: Self-pay | Admitting: Radiation Oncology

## 2023-04-07 DIAGNOSIS — Z510 Encounter for antineoplastic radiation therapy: Secondary | ICD-10-CM | POA: Diagnosis present

## 2023-04-07 DIAGNOSIS — D0511 Intraductal carcinoma in situ of right breast: Secondary | ICD-10-CM | POA: Insufficient documentation

## 2023-04-13 ENCOUNTER — Encounter: Payer: Self-pay | Admitting: *Deleted

## 2023-04-13 DIAGNOSIS — D0511 Intraductal carcinoma in situ of right breast: Secondary | ICD-10-CM

## 2023-04-14 ENCOUNTER — Telehealth: Payer: Self-pay | Admitting: Hematology and Oncology

## 2023-04-14 NOTE — Telephone Encounter (Signed)
Scheduled appointment per 1/21 scheduling message. Patient is aware of the made appointment and is active on MyChart.

## 2023-04-15 DIAGNOSIS — Z510 Encounter for antineoplastic radiation therapy: Secondary | ICD-10-CM | POA: Diagnosis not present

## 2023-04-20 ENCOUNTER — Ambulatory Visit
Admission: RE | Admit: 2023-04-20 | Discharge: 2023-04-20 | Disposition: A | Discharge status: Home / Self Care | Payer: 59 | Source: Ambulatory Visit | Attending: Radiation Oncology | Admitting: Radiation Oncology

## 2023-04-20 ENCOUNTER — Other Ambulatory Visit: Payer: Self-pay

## 2023-04-20 DIAGNOSIS — Z510 Encounter for antineoplastic radiation therapy: Secondary | ICD-10-CM | POA: Diagnosis not present

## 2023-04-20 LAB — RAD ONC ARIA SESSION SUMMARY
Course Elapsed Days: 0
Plan Fractions Treated to Date: 1
Plan Prescribed Dose Per Fraction: 1.8 Gy
Plan Total Fractions Prescribed: 28
Plan Total Prescribed Dose: 50.4 Gy
Reference Point Dosage Given to Date: 1.8 Gy
Reference Point Session Dosage Given: 1.8 Gy
Session Number: 1

## 2023-04-21 ENCOUNTER — Ambulatory Visit
Admission: RE | Admit: 2023-04-21 | Discharge: 2023-04-21 | Disposition: A | Discharge status: Home / Self Care | Payer: 59 | Source: Ambulatory Visit | Attending: Radiation Oncology | Admitting: Radiation Oncology

## 2023-04-21 ENCOUNTER — Other Ambulatory Visit: Payer: Self-pay

## 2023-04-21 DIAGNOSIS — Z510 Encounter for antineoplastic radiation therapy: Secondary | ICD-10-CM | POA: Diagnosis not present

## 2023-04-21 LAB — RAD ONC ARIA SESSION SUMMARY
Course Elapsed Days: 1
Plan Fractions Treated to Date: 2
Plan Prescribed Dose Per Fraction: 1.8 Gy
Plan Total Fractions Prescribed: 28
Plan Total Prescribed Dose: 50.4 Gy
Reference Point Dosage Given to Date: 3.6 Gy
Reference Point Session Dosage Given: 1.8 Gy
Session Number: 2

## 2023-04-22 ENCOUNTER — Ambulatory Visit
Admission: RE | Admit: 2023-04-22 | Discharge: 2023-04-22 | Disposition: A | Discharge status: Home / Self Care | Payer: 59 | Source: Ambulatory Visit | Attending: Radiation Oncology | Admitting: Radiation Oncology

## 2023-04-22 ENCOUNTER — Other Ambulatory Visit: Payer: Self-pay

## 2023-04-22 DIAGNOSIS — Z510 Encounter for antineoplastic radiation therapy: Secondary | ICD-10-CM | POA: Diagnosis not present

## 2023-04-22 LAB — RAD ONC ARIA SESSION SUMMARY
Course Elapsed Days: 2
Plan Fractions Treated to Date: 3
Plan Prescribed Dose Per Fraction: 1.8 Gy
Plan Total Fractions Prescribed: 28
Plan Total Prescribed Dose: 50.4 Gy
Reference Point Dosage Given to Date: 5.4 Gy
Reference Point Session Dosage Given: 1.8 Gy
Session Number: 3

## 2023-04-23 ENCOUNTER — Ambulatory Visit: Payer: 59

## 2023-04-26 ENCOUNTER — Other Ambulatory Visit: Payer: Self-pay

## 2023-04-26 ENCOUNTER — Ambulatory Visit
Admission: RE | Admit: 2023-04-26 | Discharge: 2023-04-26 | Disposition: A | Discharge status: Home / Self Care | Payer: 59 | Source: Ambulatory Visit | Attending: Radiation Oncology | Admitting: Radiation Oncology

## 2023-04-26 DIAGNOSIS — D0511 Intraductal carcinoma in situ of right breast: Secondary | ICD-10-CM | POA: Diagnosis present

## 2023-04-26 LAB — RAD ONC ARIA SESSION SUMMARY
Course Elapsed Days: 6
Plan Fractions Treated to Date: 4
Plan Prescribed Dose Per Fraction: 1.8 Gy
Plan Total Fractions Prescribed: 28
Plan Total Prescribed Dose: 50.4 Gy
Reference Point Dosage Given to Date: 7.2 Gy
Reference Point Session Dosage Given: 1.8 Gy
Session Number: 4

## 2023-04-27 ENCOUNTER — Ambulatory Visit
Admission: RE | Admit: 2023-04-27 | Discharge: 2023-04-27 | Disposition: A | Discharge status: Home / Self Care | Payer: 59 | Source: Ambulatory Visit | Attending: Radiation Oncology | Admitting: Radiation Oncology

## 2023-04-27 ENCOUNTER — Other Ambulatory Visit: Payer: Self-pay

## 2023-04-27 DIAGNOSIS — D0511 Intraductal carcinoma in situ of right breast: Secondary | ICD-10-CM | POA: Diagnosis not present

## 2023-04-27 LAB — RAD ONC ARIA SESSION SUMMARY
Course Elapsed Days: 7
Plan Fractions Treated to Date: 5
Plan Prescribed Dose Per Fraction: 1.8 Gy
Plan Total Fractions Prescribed: 28
Plan Total Prescribed Dose: 50.4 Gy
Reference Point Dosage Given to Date: 9 Gy
Reference Point Session Dosage Given: 1.8 Gy
Session Number: 5

## 2023-04-27 MED ORDER — RADIAPLEXRX EX GEL: Freq: Once | CUTANEOUS | Status: AC

## 2023-04-27 MED ORDER — ALRA NON-METALLIC DEODORANT (RAD-ONC)
1.0000 | Freq: Once | TOPICAL | Status: AC
  Administered 2023-04-27: 1 via TOPICAL

## 2023-04-28 ENCOUNTER — Other Ambulatory Visit: Payer: Self-pay

## 2023-04-28 ENCOUNTER — Ambulatory Visit
Admission: RE | Admit: 2023-04-28 | Discharge: 2023-04-28 | Disposition: A | Discharge status: Home / Self Care | Payer: 59 | Source: Ambulatory Visit | Attending: Radiation Oncology

## 2023-04-28 DIAGNOSIS — D0511 Intraductal carcinoma in situ of right breast: Secondary | ICD-10-CM | POA: Diagnosis not present

## 2023-04-28 LAB — RAD ONC ARIA SESSION SUMMARY
Course Elapsed Days: 8
Plan Fractions Treated to Date: 6
Plan Prescribed Dose Per Fraction: 1.8 Gy
Plan Total Fractions Prescribed: 28
Plan Total Prescribed Dose: 50.4 Gy
Reference Point Dosage Given to Date: 10.8 Gy
Reference Point Session Dosage Given: 1.8 Gy
Session Number: 6

## 2023-04-29 ENCOUNTER — Encounter: Payer: Self-pay | Admitting: Family Medicine

## 2023-04-29 ENCOUNTER — Other Ambulatory Visit: Payer: Self-pay

## 2023-04-29 ENCOUNTER — Ambulatory Visit
Admission: RE | Admit: 2023-04-29 | Discharge: 2023-04-29 | Disposition: A | Discharge status: Home / Self Care | Payer: 59 | Source: Ambulatory Visit | Attending: Radiation Oncology | Admitting: Radiation Oncology

## 2023-04-29 DIAGNOSIS — D0511 Intraductal carcinoma in situ of right breast: Secondary | ICD-10-CM | POA: Diagnosis not present

## 2023-04-29 LAB — RAD ONC ARIA SESSION SUMMARY
Course Elapsed Days: 9
Plan Fractions Treated to Date: 7
Plan Prescribed Dose Per Fraction: 1.8 Gy
Plan Total Fractions Prescribed: 28
Plan Total Prescribed Dose: 50.4 Gy
Reference Point Dosage Given to Date: 12.6 Gy
Reference Point Session Dosage Given: 1.8 Gy
Session Number: 7

## 2023-04-30 ENCOUNTER — Ambulatory Visit
Admission: RE | Admit: 2023-04-30 | Discharge: 2023-04-30 | Disposition: A | Discharge status: Home / Self Care | Payer: 59 | Source: Ambulatory Visit | Attending: Radiation Oncology

## 2023-04-30 ENCOUNTER — Other Ambulatory Visit: Payer: Self-pay

## 2023-04-30 DIAGNOSIS — D0511 Intraductal carcinoma in situ of right breast: Secondary | ICD-10-CM | POA: Diagnosis not present

## 2023-04-30 LAB — RAD ONC ARIA SESSION SUMMARY
Course Elapsed Days: 10
Plan Fractions Treated to Date: 8
Plan Prescribed Dose Per Fraction: 1.8 Gy
Plan Total Fractions Prescribed: 28
Plan Total Prescribed Dose: 50.4 Gy
Reference Point Dosage Given to Date: 14.4 Gy
Reference Point Session Dosage Given: 1.8 Gy
Session Number: 8

## 2023-05-02 ENCOUNTER — Telehealth: Payer: 59 | Admitting: Family

## 2023-05-02 DIAGNOSIS — R399 Unspecified symptoms and signs involving the genitourinary system: Secondary | ICD-10-CM | POA: Diagnosis not present

## 2023-05-02 MED ORDER — CEPHALEXIN 500 MG PO CAPS: 500.0000 mg | ORAL_CAPSULE | Freq: Two times a day (BID) | ORAL | 0 refills | Status: DC

## 2023-05-02 NOTE — Progress Notes (Signed)

## 2023-05-03 ENCOUNTER — Other Ambulatory Visit: Payer: Self-pay

## 2023-05-03 ENCOUNTER — Ambulatory Visit
Admission: RE | Admit: 2023-05-03 | Discharge: 2023-05-03 | Disposition: A | Discharge status: Home / Self Care | Payer: 59 | Source: Ambulatory Visit | Attending: Radiation Oncology | Admitting: Radiation Oncology

## 2023-05-03 DIAGNOSIS — D0511 Intraductal carcinoma in situ of right breast: Secondary | ICD-10-CM | POA: Diagnosis not present

## 2023-05-03 LAB — RAD ONC ARIA SESSION SUMMARY
Course Elapsed Days: 13
Plan Fractions Treated to Date: 9
Plan Prescribed Dose Per Fraction: 1.8 Gy
Plan Total Fractions Prescribed: 28
Plan Total Prescribed Dose: 50.4 Gy
Reference Point Dosage Given to Date: 16.2 Gy
Reference Point Session Dosage Given: 1.8 Gy
Session Number: 9

## 2023-05-04 ENCOUNTER — Ambulatory Visit
Admission: RE | Admit: 2023-05-04 | Discharge: 2023-05-04 | Disposition: A | Discharge status: Home / Self Care | Payer: 59 | Source: Ambulatory Visit | Attending: Radiation Oncology | Admitting: Radiation Oncology

## 2023-05-04 ENCOUNTER — Other Ambulatory Visit: Payer: Self-pay

## 2023-05-04 DIAGNOSIS — D0511 Intraductal carcinoma in situ of right breast: Secondary | ICD-10-CM | POA: Diagnosis not present

## 2023-05-04 LAB — RAD ONC ARIA SESSION SUMMARY
Course Elapsed Days: 14
Plan Fractions Treated to Date: 10
Plan Prescribed Dose Per Fraction: 1.8 Gy
Plan Total Fractions Prescribed: 28
Plan Total Prescribed Dose: 50.4 Gy
Reference Point Dosage Given to Date: 18 Gy
Reference Point Session Dosage Given: 1.8 Gy
Session Number: 10

## 2023-05-05 ENCOUNTER — Ambulatory Visit
Admission: RE | Admit: 2023-05-05 | Discharge: 2023-05-05 | Disposition: A | Discharge status: Home / Self Care | Payer: 59 | Source: Ambulatory Visit | Attending: Radiation Oncology

## 2023-05-05 ENCOUNTER — Other Ambulatory Visit: Payer: Self-pay

## 2023-05-05 DIAGNOSIS — D0511 Intraductal carcinoma in situ of right breast: Secondary | ICD-10-CM | POA: Diagnosis not present

## 2023-05-05 LAB — RAD ONC ARIA SESSION SUMMARY
Course Elapsed Days: 15
Plan Fractions Treated to Date: 11
Plan Prescribed Dose Per Fraction: 1.8 Gy
Plan Total Fractions Prescribed: 28
Plan Total Prescribed Dose: 50.4 Gy
Reference Point Dosage Given to Date: 19.8 Gy
Reference Point Session Dosage Given: 1.8 Gy
Session Number: 11

## 2023-05-06 ENCOUNTER — Ambulatory Visit
Admission: RE | Admit: 2023-05-06 | Discharge: 2023-05-06 | Disposition: A | Discharge status: Home / Self Care | Payer: 59 | Source: Ambulatory Visit | Attending: Radiation Oncology

## 2023-05-06 ENCOUNTER — Other Ambulatory Visit: Payer: Self-pay

## 2023-05-06 ENCOUNTER — Ambulatory Visit
Admission: RE | Admit: 2023-05-06 | Discharge: 2023-05-06 | Disposition: A | Discharge status: Home / Self Care | Payer: 59 | Source: Ambulatory Visit | Attending: Radiation Oncology | Admitting: Radiation Oncology

## 2023-05-06 DIAGNOSIS — D0511 Intraductal carcinoma in situ of right breast: Secondary | ICD-10-CM | POA: Diagnosis not present

## 2023-05-06 LAB — RAD ONC ARIA SESSION SUMMARY
Course Elapsed Days: 16
Plan Fractions Treated to Date: 12
Plan Prescribed Dose Per Fraction: 1.8 Gy
Plan Total Fractions Prescribed: 28
Plan Total Prescribed Dose: 50.4 Gy
Reference Point Dosage Given to Date: 21.6 Gy
Reference Point Session Dosage Given: 1.8 Gy
Session Number: 12

## 2023-05-07 ENCOUNTER — Ambulatory Visit
Admission: RE | Admit: 2023-05-07 | Discharge: 2023-05-07 | Disposition: A | Discharge status: Home / Self Care | Payer: 59 | Source: Ambulatory Visit | Attending: Radiation Oncology | Admitting: Radiation Oncology

## 2023-05-07 ENCOUNTER — Other Ambulatory Visit: Payer: Self-pay

## 2023-05-07 DIAGNOSIS — D0511 Intraductal carcinoma in situ of right breast: Secondary | ICD-10-CM | POA: Diagnosis not present

## 2023-05-07 LAB — RAD ONC ARIA SESSION SUMMARY
Course Elapsed Days: 17
Plan Fractions Treated to Date: 13
Plan Prescribed Dose Per Fraction: 1.8 Gy
Plan Total Fractions Prescribed: 28
Plan Total Prescribed Dose: 50.4 Gy
Reference Point Dosage Given to Date: 23.4 Gy
Reference Point Session Dosage Given: 1.8 Gy
Session Number: 13

## 2023-05-10 ENCOUNTER — Ambulatory Visit
Admission: RE | Admit: 2023-05-10 | Discharge: 2023-05-10 | Disposition: A | Discharge status: Home / Self Care | Payer: 59 | Source: Ambulatory Visit | Attending: Radiation Oncology

## 2023-05-10 ENCOUNTER — Other Ambulatory Visit: Payer: Self-pay

## 2023-05-10 DIAGNOSIS — D0511 Intraductal carcinoma in situ of right breast: Secondary | ICD-10-CM | POA: Diagnosis not present

## 2023-05-10 LAB — RAD ONC ARIA SESSION SUMMARY
Course Elapsed Days: 20
Plan Fractions Treated to Date: 14
Plan Prescribed Dose Per Fraction: 1.8 Gy
Plan Total Fractions Prescribed: 28
Plan Total Prescribed Dose: 50.4 Gy
Reference Point Dosage Given to Date: 25.2 Gy
Reference Point Session Dosage Given: 1.8 Gy
Session Number: 14

## 2023-05-11 ENCOUNTER — Other Ambulatory Visit: Payer: Self-pay

## 2023-05-11 ENCOUNTER — Ambulatory Visit
Admission: RE | Admit: 2023-05-11 | Discharge: 2023-05-11 | Disposition: A | Discharge status: Home / Self Care | Payer: 59 | Source: Ambulatory Visit | Attending: Radiation Oncology

## 2023-05-11 DIAGNOSIS — D0511 Intraductal carcinoma in situ of right breast: Secondary | ICD-10-CM | POA: Diagnosis not present

## 2023-05-11 LAB — RAD ONC ARIA SESSION SUMMARY
Course Elapsed Days: 21
Plan Fractions Treated to Date: 15
Plan Prescribed Dose Per Fraction: 1.8 Gy
Plan Total Fractions Prescribed: 28
Plan Total Prescribed Dose: 50.4 Gy
Reference Point Dosage Given to Date: 27 Gy
Reference Point Session Dosage Given: 1.8 Gy
Session Number: 15

## 2023-05-12 ENCOUNTER — Ambulatory Visit
Admission: RE | Admit: 2023-05-12 | Discharge: 2023-05-12 | Disposition: A | Discharge status: Home / Self Care | Payer: 59 | Source: Ambulatory Visit | Attending: Radiation Oncology | Admitting: Radiation Oncology

## 2023-05-12 ENCOUNTER — Other Ambulatory Visit: Payer: Self-pay

## 2023-05-12 DIAGNOSIS — D0511 Intraductal carcinoma in situ of right breast: Secondary | ICD-10-CM | POA: Diagnosis not present

## 2023-05-12 LAB — RAD ONC ARIA SESSION SUMMARY
Course Elapsed Days: 22
Plan Fractions Treated to Date: 16
Plan Prescribed Dose Per Fraction: 1.8 Gy
Plan Total Fractions Prescribed: 28
Plan Total Prescribed Dose: 50.4 Gy
Reference Point Dosage Given to Date: 28.8 Gy
Reference Point Session Dosage Given: 1.8 Gy
Session Number: 16

## 2023-05-13 ENCOUNTER — Ambulatory Visit
Admission: RE | Admit: 2023-05-13 | Discharge: 2023-05-13 | Disposition: A | Discharge status: Home / Self Care | Payer: 59 | Source: Ambulatory Visit | Attending: Radiation Oncology

## 2023-05-13 ENCOUNTER — Ambulatory Visit
Admission: RE | Admit: 2023-05-13 | Discharge: 2023-05-13 | Disposition: A | Discharge status: Home / Self Care | Payer: 59 | Source: Ambulatory Visit | Attending: Radiation Oncology | Admitting: Radiation Oncology

## 2023-05-13 ENCOUNTER — Other Ambulatory Visit: Payer: Self-pay

## 2023-05-13 DIAGNOSIS — D0511 Intraductal carcinoma in situ of right breast: Secondary | ICD-10-CM | POA: Diagnosis not present

## 2023-05-13 LAB — RAD ONC ARIA SESSION SUMMARY
Course Elapsed Days: 23
Plan Fractions Treated to Date: 17
Plan Prescribed Dose Per Fraction: 1.8 Gy
Plan Total Fractions Prescribed: 28
Plan Total Prescribed Dose: 50.4 Gy
Reference Point Dosage Given to Date: 30.6 Gy
Reference Point Session Dosage Given: 1.8 Gy
Session Number: 17

## 2023-05-14 ENCOUNTER — Ambulatory Visit
Admission: RE | Admit: 2023-05-14 | Discharge: 2023-05-14 | Disposition: A | Discharge status: Home / Self Care | Payer: 59 | Source: Ambulatory Visit | Attending: Radiation Oncology | Admitting: Radiation Oncology

## 2023-05-14 ENCOUNTER — Other Ambulatory Visit: Payer: Self-pay

## 2023-05-14 DIAGNOSIS — D0511 Intraductal carcinoma in situ of right breast: Secondary | ICD-10-CM | POA: Diagnosis not present

## 2023-05-14 LAB — RAD ONC ARIA SESSION SUMMARY
Course Elapsed Days: 24
Plan Fractions Treated to Date: 18
Plan Prescribed Dose Per Fraction: 1.8 Gy
Plan Total Fractions Prescribed: 28
Plan Total Prescribed Dose: 50.4 Gy
Reference Point Dosage Given to Date: 32.4 Gy
Reference Point Session Dosage Given: 1.8 Gy
Session Number: 18

## 2023-05-17 ENCOUNTER — Other Ambulatory Visit: Payer: Self-pay

## 2023-05-17 ENCOUNTER — Ambulatory Visit
Admission: RE | Admit: 2023-05-17 | Discharge: 2023-05-17 | Disposition: A | Discharge status: Home / Self Care | Payer: 59 | Source: Ambulatory Visit | Attending: Radiation Oncology | Admitting: Radiation Oncology

## 2023-05-17 DIAGNOSIS — D0511 Intraductal carcinoma in situ of right breast: Secondary | ICD-10-CM | POA: Diagnosis not present

## 2023-05-17 LAB — RAD ONC ARIA SESSION SUMMARY
Course Elapsed Days: 27
Plan Fractions Treated to Date: 19
Plan Prescribed Dose Per Fraction: 1.8 Gy
Plan Total Fractions Prescribed: 28
Plan Total Prescribed Dose: 50.4 Gy
Reference Point Dosage Given to Date: 34.2 Gy
Reference Point Session Dosage Given: 1.8 Gy
Session Number: 19

## 2023-05-18 ENCOUNTER — Ambulatory Visit
Admission: RE | Admit: 2023-05-18 | Discharge: 2023-05-18 | Disposition: A | Discharge status: Home / Self Care | Payer: 59 | Source: Ambulatory Visit | Attending: Radiation Oncology

## 2023-05-18 ENCOUNTER — Other Ambulatory Visit: Payer: Self-pay

## 2023-05-18 DIAGNOSIS — D0511 Intraductal carcinoma in situ of right breast: Secondary | ICD-10-CM | POA: Diagnosis not present

## 2023-05-18 LAB — RAD ONC ARIA SESSION SUMMARY
Course Elapsed Days: 28
Plan Fractions Treated to Date: 20
Plan Prescribed Dose Per Fraction: 1.8 Gy
Plan Total Fractions Prescribed: 28
Plan Total Prescribed Dose: 50.4 Gy
Reference Point Dosage Given to Date: 36 Gy
Reference Point Session Dosage Given: 1.8 Gy
Session Number: 20

## 2023-05-19 ENCOUNTER — Ambulatory Visit
Admission: RE | Admit: 2023-05-19 | Discharge: 2023-05-19 | Disposition: A | Discharge status: Home / Self Care | Payer: 59 | Source: Ambulatory Visit | Attending: Radiation Oncology

## 2023-05-19 ENCOUNTER — Other Ambulatory Visit: Payer: Self-pay

## 2023-05-19 DIAGNOSIS — D0511 Intraductal carcinoma in situ of right breast: Secondary | ICD-10-CM | POA: Diagnosis not present

## 2023-05-19 LAB — RAD ONC ARIA SESSION SUMMARY
Course Elapsed Days: 29
Plan Fractions Treated to Date: 21
Plan Prescribed Dose Per Fraction: 1.8 Gy
Plan Total Fractions Prescribed: 28
Plan Total Prescribed Dose: 50.4 Gy
Reference Point Dosage Given to Date: 37.8 Gy
Reference Point Session Dosage Given: 1.8 Gy
Session Number: 21

## 2023-05-20 ENCOUNTER — Other Ambulatory Visit: Payer: Self-pay

## 2023-05-20 ENCOUNTER — Ambulatory Visit
Admission: RE | Admit: 2023-05-20 | Discharge: 2023-05-20 | Disposition: A | Discharge status: Home / Self Care | Payer: 59 | Source: Ambulatory Visit | Attending: Radiation Oncology

## 2023-05-20 DIAGNOSIS — D0511 Intraductal carcinoma in situ of right breast: Secondary | ICD-10-CM | POA: Diagnosis not present

## 2023-05-20 LAB — RAD ONC ARIA SESSION SUMMARY
Course Elapsed Days: 30
Plan Fractions Treated to Date: 22
Plan Prescribed Dose Per Fraction: 1.8 Gy
Plan Total Fractions Prescribed: 28
Plan Total Prescribed Dose: 50.4 Gy
Reference Point Dosage Given to Date: 39.6 Gy
Reference Point Session Dosage Given: 1.8 Gy
Session Number: 22

## 2023-05-21 ENCOUNTER — Ambulatory Visit
Admission: RE | Admit: 2023-05-21 | Discharge: 2023-05-21 | Disposition: A | Discharge status: Home / Self Care | Payer: 59 | Source: Ambulatory Visit | Attending: Radiation Oncology | Admitting: Radiation Oncology

## 2023-05-21 ENCOUNTER — Other Ambulatory Visit: Payer: Self-pay

## 2023-05-21 ENCOUNTER — Ambulatory Visit: Payer: 59 | Admitting: Radiation Oncology

## 2023-05-21 DIAGNOSIS — D0511 Intraductal carcinoma in situ of right breast: Secondary | ICD-10-CM | POA: Diagnosis not present

## 2023-05-21 LAB — RAD ONC ARIA SESSION SUMMARY
Course Elapsed Days: 31
Plan Fractions Treated to Date: 23
Plan Prescribed Dose Per Fraction: 1.8 Gy
Plan Total Fractions Prescribed: 28
Plan Total Prescribed Dose: 50.4 Gy
Reference Point Dosage Given to Date: 41.4 Gy
Reference Point Session Dosage Given: 1.8 Gy
Session Number: 23

## 2023-05-24 ENCOUNTER — Ambulatory Visit
Admission: RE | Admit: 2023-05-24 | Discharge: 2023-05-24 | Disposition: A | Discharge status: Home / Self Care | Payer: 59 | Source: Ambulatory Visit | Attending: Radiation Oncology | Admitting: Radiation Oncology

## 2023-05-24 ENCOUNTER — Other Ambulatory Visit: Payer: Self-pay

## 2023-05-24 ENCOUNTER — Inpatient Hospital Stay (HOSPITAL_BASED_OUTPATIENT_CLINIC_OR_DEPARTMENT_OTHER): Payer: 59 | Admitting: Hematology and Oncology

## 2023-05-24 VITALS — BP 118/68 | HR 75 | Temp 98.4°F | Resp 16 | Ht 68.0 in | Wt 167.4 lb

## 2023-05-24 DIAGNOSIS — D0511 Intraductal carcinoma in situ of right breast: Secondary | ICD-10-CM | POA: Insufficient documentation

## 2023-05-24 LAB — RAD ONC ARIA SESSION SUMMARY
Course Elapsed Days: 34
Plan Fractions Treated to Date: 24
Plan Prescribed Dose Per Fraction: 1.8 Gy
Plan Total Fractions Prescribed: 28
Plan Total Prescribed Dose: 50.4 Gy
Reference Point Dosage Given to Date: 43.2 Gy
Reference Point Session Dosage Given: 1.8 Gy
Session Number: 24

## 2023-05-24 MED ORDER — TAMOXIFEN CITRATE 10 MG PO TABS: 5.0000 mg | ORAL_TABLET | Freq: Every day | ORAL | 1 refills | Status: DC

## 2023-05-24 NOTE — Assessment & Plan Note (Signed)
 03/09/2023:Right lumpectomy: 1 cm DCIS grade 3 with necrosis, margins negative, ER 90%, PR 90% 04/20/2022-06/04/2022: Adjuvant radiation  Tamoxifen counseling: We discussed the risks and benefits of tamoxifen. These include but not limited to insomnia, hot flashes, mood changes, vaginal dryness, and weight gain. Although rare, serious side effects including endometrial cancer, risk of blood clots were also discussed. We strongly believe that the benefits far outweigh the risks. Patient understands these risks and consented to starting treatment. Planned treatment duration is 5 years.  Return to clinic in 3 months for survivorship care plan visit

## 2023-05-24 NOTE — Progress Notes (Signed)
 Patient Care Team: Melida Quitter, PA as PCP - General (Family Medicine) Waynard Reeds, MD as Consulting Physician (Obstetrics and Gynecology) Pershing Proud, RN as Oncology Nurse Navigator Donnelly Angelica, RN as Oncology Nurse Navigator Serena Croissant, MD as Consulting Physician (Hematology and Oncology)  DIAGNOSIS:  Encounter Diagnosis  Name Primary?   Ductal carcinoma in situ (DCIS) of right breast Yes    SUMMARY OF ONCOLOGIC HISTORY: Oncology History  Ductal carcinoma in situ (DCIS) of right breast  02/22/2023 Initial Diagnosis   Ductal carcinoma in situ (DCIS) of right breast   03/05/2023 Cancer Staging   Staging form: Breast, AJCC 8th Edition - Clinical stage from 03/05/2023: Stage 0 (cTis (DCIS), cN0, cM0, G3, ER+, PR+, HER2: Not Assessed) - Signed by Serena Croissant, MD on 03/05/2023 Stage prefix: Initial diagnosis Histologic grading system: 3 grade system   03/09/2023 Genetic Testing   Negative genetic testing on the BRCAPlus panel and CancerNext-RNAinsight.  The report date is 03/09/2023 and 03/11/2023.   The BRCAPlus gene panel offered by [facility] and includes sequencing and rearrangement analysis for the following 13 genes: ATM, BARD1, BRCA1, BRCA2, CDH1, CHEK2, NF1, PALB2, PTEN, RAD51C, RAD51D, STK11 and TP53.  The [facility] CancerNext+RNAinsight Panel includes sequencing, rearrangement analysis, and RNA analysis for the following 39 genes: APC, ATM, BAP1, BARD1, BMPR1A, BRCA1, BRCA2, BRIP1, CDH1, CDKN2A, CHEK2, FH, FLCN, MET, MLH1, MSH2, MSH6, MUTYH, NF1, NTHL1, PALB2, PMS2, PTEN, RAD51C, RAD51D, SMAD4, STK11, TP53, TSC1, TSC2, and VHL (sequencing and deletion/duplication); AXIN2, HOXB13, MBD4, MSH3, POLD1 and POLE (sequencing only); EPCAM and GREM1 (deletion/duplication only).    03/09/2023 Surgery   Right lumpectomy: 1 cm DCIS grade 3 with necrosis, margins negative, ER 90%, PR 90%     CHIEF COMPLIANT: F/U on XRT  HISTORY OF PRESENT ILLNESS:   History of  Present Illness The patient, with a history of breast cancer, is currently undergoing radiation therapy. She reports skin irritation as a side effect of the treatment, which is expected to conclude in about two weeks.     ALLERGIES:  is allergic to bee venom and wasp venom.  MEDICATIONS:  Current Outpatient Medications  Medication Sig Dispense Refill   [START ON 06/22/2023] tamoxifen (NOLVADEX) 10 MG tablet Take 0.5 tablets (5 mg total) by mouth daily. 90 tablet 1   cephALEXin (KEFLEX) 500 MG capsule Take 1 capsule (500 mg total) by mouth 2 (two) times daily. 14 capsule 0   No current facility-administered medications for this visit.    PHYSICAL EXAMINATION: ECOG PERFORMANCE STATUS: 1 - Symptomatic but completely ambulatory  Vitals:   05/24/23 1350  BP: 118/68  Pulse: 75  Resp: 16  Temp: 98.4 F (36.9 C)  SpO2: 100%   Filed Weights   05/24/23 1350  Weight: 167 lb 6.4 oz (75.9 kg)    Physical Exam   (exam performed in the presence of a chaperone)  LABORATORY DATA:  I have reviewed the data as listed    Latest Ref Rng & Units 01/07/2023    8:27 AM 06/12/2020    8:50 AM 04/01/2017    8:33 AM  CMP  Glucose 70 - 99 mg/dL 87  55  80   BUN 6 - 20 mg/dL 11  8  9    Creatinine 0.57 - 1.00 mg/dL 1.61  0.96  0.45   Sodium 134 - 144 mmol/L 142  138  144   Potassium 3.5 - 5.2 mmol/L 4.8  4.2  4.8   Chloride 96 - 106 mmol/L  106  103  109   CO2 20 - 29 mmol/L 22  22  21    Calcium 8.7 - 10.2 mg/dL 9.1  9.1  9.1   Total Protein 6.0 - 8.5 g/dL 7.4  7.0  7.3   Total Bilirubin 0.0 - 1.2 mg/dL 0.6  0.5  0.6   Alkaline Phos 44 - 121 IU/L 69  64  54   AST 0 - 40 IU/L 32  25  18   ALT 0 - 32 IU/L 37  16  12     Lab Results  Component Value Date   WBC 7.7 03/05/2023   HGB 14.1 03/05/2023   HCT 41.1 03/05/2023   MCV 93.0 03/05/2023   PLT 229 03/05/2023   NEUTROABS 3.7 01/07/2023    ASSESSMENT & PLAN:  Ductal carcinoma in situ (DCIS) of right breast 03/09/2023:Right  lumpectomy: 1 cm DCIS grade 3 with necrosis, margins negative, ER 90%, PR 90% 04/20/2022-06/04/2022: Adjuvant radiation  Tamoxifen counseling: We discussed the risks and benefits of tamoxifen. These include but not limited to insomnia, hot flashes, mood changes, vaginal dryness, and weight gain. Although rare, serious side effects including endometrial cancer, risk of blood clots were also discussed. We strongly believe that the benefits far outweigh the risks. Patient understands these risks and consented to starting treatment. Planned treatment duration is 5 years.  Start Tamoxifen 5 mg daily starting 06/22/23 Return to clinic in 3 months for survivorship care plan visit     No orders of the defined types were placed in this encounter.  The patient has a good understanding of the overall plan. she agrees with it. she will call with any problems that may develop before the next visit here. Total time spent: 30 mins including face to face time and time spent for planning, charting and co-ordination of care   Tamsen Meek, MD 05/24/23

## 2023-05-25 ENCOUNTER — Other Ambulatory Visit: Payer: Self-pay

## 2023-05-25 ENCOUNTER — Ambulatory Visit
Admission: RE | Admit: 2023-05-25 | Discharge: 2023-05-25 | Disposition: A | Discharge status: Home / Self Care | Payer: 59 | Source: Ambulatory Visit | Attending: Radiation Oncology

## 2023-05-25 DIAGNOSIS — D0511 Intraductal carcinoma in situ of right breast: Secondary | ICD-10-CM | POA: Diagnosis not present

## 2023-05-25 LAB — RAD ONC ARIA SESSION SUMMARY
Course Elapsed Days: 35
Plan Fractions Treated to Date: 25
Plan Prescribed Dose Per Fraction: 1.8 Gy
Plan Total Fractions Prescribed: 28
Plan Total Prescribed Dose: 50.4 Gy
Reference Point Dosage Given to Date: 45 Gy
Reference Point Session Dosage Given: 1.8 Gy
Session Number: 25

## 2023-05-26 ENCOUNTER — Other Ambulatory Visit: Payer: Self-pay

## 2023-05-26 ENCOUNTER — Ambulatory Visit
Admission: RE | Admit: 2023-05-26 | Discharge: 2023-05-26 | Disposition: A | Discharge status: Home / Self Care | Payer: 59 | Source: Ambulatory Visit | Attending: Radiation Oncology

## 2023-05-26 DIAGNOSIS — D0511 Intraductal carcinoma in situ of right breast: Secondary | ICD-10-CM | POA: Diagnosis not present

## 2023-05-26 LAB — RAD ONC ARIA SESSION SUMMARY
Course Elapsed Days: 36
Plan Fractions Treated to Date: 26
Plan Prescribed Dose Per Fraction: 1.8 Gy
Plan Total Fractions Prescribed: 28
Plan Total Prescribed Dose: 50.4 Gy
Reference Point Dosage Given to Date: 46.8 Gy
Reference Point Session Dosage Given: 1.8 Gy
Session Number: 26

## 2023-05-27 ENCOUNTER — Other Ambulatory Visit: Payer: Self-pay

## 2023-05-27 ENCOUNTER — Ambulatory Visit
Admission: RE | Admit: 2023-05-27 | Discharge: 2023-05-27 | Disposition: A | Discharge status: Home / Self Care | Payer: 59 | Source: Ambulatory Visit | Attending: Radiation Oncology | Admitting: Radiation Oncology

## 2023-05-27 DIAGNOSIS — D0511 Intraductal carcinoma in situ of right breast: Secondary | ICD-10-CM | POA: Diagnosis not present

## 2023-05-27 LAB — RAD ONC ARIA SESSION SUMMARY
Course Elapsed Days: 37
Plan Fractions Treated to Date: 27
Plan Prescribed Dose Per Fraction: 1.8 Gy
Plan Total Fractions Prescribed: 28
Plan Total Prescribed Dose: 50.4 Gy
Reference Point Dosage Given to Date: 48.6 Gy
Reference Point Session Dosage Given: 1.8 Gy
Session Number: 27

## 2023-05-28 ENCOUNTER — Other Ambulatory Visit: Payer: Self-pay

## 2023-05-28 ENCOUNTER — Ambulatory Visit
Admission: RE | Admit: 2023-05-28 | Discharge: 2023-05-28 | Disposition: A | Discharge status: Home / Self Care | Payer: 59 | Source: Ambulatory Visit | Attending: Radiation Oncology | Admitting: Radiation Oncology

## 2023-05-28 ENCOUNTER — Ambulatory Visit: Payer: 59

## 2023-05-28 ENCOUNTER — Ambulatory Visit
Admission: RE | Admit: 2023-05-28 | Discharge: 2023-05-28 | Disposition: A | Discharge status: Home / Self Care | Source: Ambulatory Visit | Attending: Radiation Oncology | Admitting: Radiation Oncology

## 2023-05-28 DIAGNOSIS — D0511 Intraductal carcinoma in situ of right breast: Secondary | ICD-10-CM | POA: Diagnosis not present

## 2023-05-28 LAB — RAD ONC ARIA SESSION SUMMARY
Course Elapsed Days: 38
Plan Fractions Treated to Date: 28
Plan Prescribed Dose Per Fraction: 1.8 Gy
Plan Total Fractions Prescribed: 28
Plan Total Prescribed Dose: 50.4 Gy
Reference Point Dosage Given to Date: 50.4 Gy
Reference Point Session Dosage Given: 1.8 Gy
Session Number: 28

## 2023-05-31 ENCOUNTER — Ambulatory Visit
Admission: RE | Admit: 2023-05-31 | Discharge: 2023-05-31 | Disposition: A | Discharge status: Home / Self Care | Payer: 59 | Source: Ambulatory Visit | Attending: Radiation Oncology

## 2023-05-31 ENCOUNTER — Other Ambulatory Visit: Payer: Self-pay

## 2023-05-31 ENCOUNTER — Ambulatory Visit: Payer: 59

## 2023-05-31 DIAGNOSIS — D0511 Intraductal carcinoma in situ of right breast: Secondary | ICD-10-CM | POA: Diagnosis not present

## 2023-05-31 LAB — RAD ONC ARIA SESSION SUMMARY
Course Elapsed Days: 41
Plan Fractions Treated to Date: 1
Plan Prescribed Dose Per Fraction: 2 Gy
Plan Total Fractions Prescribed: 5
Plan Total Prescribed Dose: 10 Gy
Reference Point Dosage Given to Date: 2 Gy
Reference Point Session Dosage Given: 2 Gy
Session Number: 29

## 2023-06-01 ENCOUNTER — Ambulatory Visit
Admission: RE | Admit: 2023-06-01 | Discharge: 2023-06-01 | Disposition: A | Discharge status: Home / Self Care | Payer: 59 | Source: Ambulatory Visit | Attending: Radiation Oncology | Admitting: Radiation Oncology

## 2023-06-01 ENCOUNTER — Other Ambulatory Visit: Payer: Self-pay

## 2023-06-01 DIAGNOSIS — D0511 Intraductal carcinoma in situ of right breast: Secondary | ICD-10-CM | POA: Diagnosis not present

## 2023-06-01 LAB — RAD ONC ARIA SESSION SUMMARY
Course Elapsed Days: 42
Plan Fractions Treated to Date: 2
Plan Prescribed Dose Per Fraction: 2 Gy
Plan Total Fractions Prescribed: 5
Plan Total Prescribed Dose: 10 Gy
Reference Point Dosage Given to Date: 4 Gy
Reference Point Session Dosage Given: 2 Gy
Session Number: 30

## 2023-06-02 ENCOUNTER — Other Ambulatory Visit: Payer: Self-pay

## 2023-06-02 ENCOUNTER — Ambulatory Visit
Admission: RE | Admit: 2023-06-02 | Discharge: 2023-06-02 | Disposition: A | Discharge status: Home / Self Care | Payer: 59 | Source: Ambulatory Visit | Attending: Radiation Oncology | Admitting: Radiation Oncology

## 2023-06-02 DIAGNOSIS — D0511 Intraductal carcinoma in situ of right breast: Secondary | ICD-10-CM | POA: Diagnosis not present

## 2023-06-02 LAB — RAD ONC ARIA SESSION SUMMARY
Course Elapsed Days: 43
Plan Fractions Treated to Date: 3
Plan Prescribed Dose Per Fraction: 2 Gy
Plan Total Fractions Prescribed: 5
Plan Total Prescribed Dose: 10 Gy
Reference Point Dosage Given to Date: 6 Gy
Reference Point Session Dosage Given: 2 Gy
Session Number: 31

## 2023-06-03 ENCOUNTER — Other Ambulatory Visit: Payer: Self-pay

## 2023-06-03 ENCOUNTER — Ambulatory Visit
Admission: RE | Admit: 2023-06-03 | Discharge: 2023-06-03 | Disposition: A | Discharge status: Home / Self Care | Payer: 59 | Source: Ambulatory Visit | Attending: Radiation Oncology | Admitting: Radiation Oncology

## 2023-06-03 ENCOUNTER — Ambulatory Visit: Payer: 59

## 2023-06-03 DIAGNOSIS — D0511 Intraductal carcinoma in situ of right breast: Secondary | ICD-10-CM | POA: Diagnosis not present

## 2023-06-03 LAB — RAD ONC ARIA SESSION SUMMARY
Course Elapsed Days: 44
Plan Fractions Treated to Date: 4
Plan Prescribed Dose Per Fraction: 2 Gy
Plan Total Fractions Prescribed: 5
Plan Total Prescribed Dose: 10 Gy
Reference Point Dosage Given to Date: 8 Gy
Reference Point Session Dosage Given: 2 Gy
Session Number: 32

## 2023-06-04 ENCOUNTER — Other Ambulatory Visit: Payer: Self-pay

## 2023-06-04 ENCOUNTER — Ambulatory Visit
Admission: RE | Admit: 2023-06-04 | Discharge: 2023-06-04 | Disposition: A | Discharge status: Home / Self Care | Source: Ambulatory Visit | Attending: Radiation Oncology | Admitting: Radiation Oncology

## 2023-06-04 ENCOUNTER — Ambulatory Visit
Admission: RE | Admit: 2023-06-04 | Discharge: 2023-06-04 | Disposition: A | Discharge status: Home / Self Care | Payer: 59 | Source: Ambulatory Visit | Attending: Radiation Oncology | Admitting: Radiation Oncology

## 2023-06-04 DIAGNOSIS — D0511 Intraductal carcinoma in situ of right breast: Secondary | ICD-10-CM | POA: Diagnosis not present

## 2023-06-04 LAB — RAD ONC ARIA SESSION SUMMARY
Course Elapsed Days: 45
Plan Fractions Treated to Date: 5
Plan Prescribed Dose Per Fraction: 2 Gy
Plan Total Fractions Prescribed: 5
Plan Total Prescribed Dose: 10 Gy
Reference Point Dosage Given to Date: 10 Gy
Reference Point Session Dosage Given: 2 Gy
Session Number: 33

## 2023-06-07 NOTE — Radiation Completion Notes (Addendum)
  Radiation Oncology         [phone] ________________________________  Name: Miranda Davis MRN: [id number]  Date of Service: 06/04/2023  DOB: 1981-02-25  End of Treatment Note    Diagnosis: High grade ER/PR positive DCIS of the right breast.    Intent: Curative     ==========DELIVERED PLANS==========  First Treatment Date: 2023-04-20 Last Treatment Date: 2023-06-04   Plan Name: Breast_R Site: Breast, Right Technique: 3D Mode: Photon Dose Per Fraction: 1.8 Gy Prescribed Dose (Delivered / Prescribed): 50.4 Gy / 50.4 Gy Prescribed Fxs (Delivered / Prescribed): 28 / 28   Plan Name: Breast_R_Bst Site: Breast, Right Technique: 3D Mode: Photon Dose Per Fraction: 2 Gy Prescribed Dose (Delivered / Prescribed): 10 Gy / 10 Gy Prescribed Fxs (Delivered / Prescribed): 5 / 5     ==========ON TREATMENT VISIT DATES========== 2023-04-27, 2023-04-30, 2023-05-06, 2023-05-13, 2023-05-21, 2023-05-28, 2023-06-04    See weekly On Treatment Notes in Epic for details in the Media tab (listed as Progress notes on the On Treatment Visit Dates listed above). The patient tolerated radiation. She developed fatigue and anticipated skin changes in the treatment field.   The patient will receive a call in about one month from the radiation oncology department. She will continue follow up with Dr. Pamelia Hoit as well.      Osker Mason, PAC

## 2023-07-02 NOTE — Progress Notes (Addendum)
  Radiation Oncology         [phone] ________________________________  Name: Miranda Davis MRN: [id number]  Date of Service: 07/02/2023  DOB: April 27, 1980  Post Treatment Telephone Note  Diagnosis:  High grade ER/PR positive DCIS of the right breast. (as documented in provider EOT note)  The patient was available for call today.   Symptoms of fatigue have improved since completing therapy.  Symptoms of skin changes have improved since completing therapy.  The patient was encouraged to avoid sun exposure in the area of prior treatment for up to one year following radiation with either sunscreen or by the style of clothing worn in the sun.  The patient has scheduled follow up with her medical oncologist Dr. Pamelia Hoit for ongoing surveillance, and was encouraged to call if she develops concerns or questions regarding radiation.  This concludes the interaction.  Ruel Favors, LPN

## 2023-07-05 ENCOUNTER — Ambulatory Visit
Admission: RE | Admit: 2023-07-05 | Discharge: 2023-07-05 | Disposition: A | Discharge status: Home / Self Care | Source: Ambulatory Visit | Attending: Adult Health | Admitting: Adult Health

## 2023-07-05 DIAGNOSIS — D0511 Intraductal carcinoma in situ of right breast: Secondary | ICD-10-CM | POA: Insufficient documentation

## 2023-08-04 ENCOUNTER — Telehealth: Payer: Self-pay | Admitting: Adult Health

## 2023-08-23 ENCOUNTER — Encounter: Admitting: Adult Health

## 2023-08-31 ENCOUNTER — Encounter: Payer: Self-pay | Admitting: Adult Health

## 2023-08-31 ENCOUNTER — Inpatient Hospital Stay: Attending: Adult Health | Admitting: Adult Health

## 2023-08-31 VITALS — BP 123/68 | HR 65 | Temp 98.2°F | Resp 17 | Ht 68.0 in | Wt 164.7 lb

## 2023-08-31 DIAGNOSIS — D0511 Intraductal carcinoma in situ of right breast: Secondary | ICD-10-CM | POA: Diagnosis not present

## 2023-08-31 DIAGNOSIS — Z9189 Other specified personal risk factors, not elsewhere classified: Secondary | ICD-10-CM | POA: Diagnosis not present

## 2023-08-31 DIAGNOSIS — Z79810 Long term (current) use of selective estrogen receptor modulators (SERMs): Secondary | ICD-10-CM | POA: Insufficient documentation

## 2023-08-31 DIAGNOSIS — Z923 Personal history of irradiation: Secondary | ICD-10-CM | POA: Diagnosis not present

## 2023-08-31 NOTE — Progress Notes (Signed)
 SURVIVORSHIP VISIT:  BRIEF ONCOLOGIC HISTORY:  Oncology History  Ductal carcinoma in situ (DCIS) of right breast  02/22/2023 Initial Diagnosis   Ductal carcinoma in situ (DCIS) of right breast   03/05/2023 Cancer Staging   Staging form: Breast, AJCC 8th Edition - Clinical stage from 03/05/2023: Stage 0 (cTis (DCIS), cN0, cM0, G3, ER+, PR+, HER2: Not Assessed) - Signed by Cameron Cea, MD on 03/05/2023 Stage prefix: Initial diagnosis Histologic grading system: 3 grade system   03/09/2023 Genetic Testing   Negative genetic testing on the BRCAPlus panel and CancerNext-RNAinsight.  The report date is 03/09/2023 and 03/11/2023.   The BRCAPlus gene panel offered by [facility] and includes sequencing and rearrangement analysis for the following 13 genes: ATM, BARD1, BRCA1, BRCA2, CDH1, CHEK2, NF1, PALB2, PTEN, RAD51C, RAD51D, STK11 and TP53.  The [facility] CancerNext+RNAinsight Panel includes sequencing, rearrangement analysis, and RNA analysis for the following 39 genes: APC, ATM, BAP1, BARD1, BMPR1A, BRCA1, BRCA2, BRIP1, CDH1, CDKN2A, CHEK2, FH, FLCN, MET, MLH1, MSH2, MSH6, MUTYH, NF1, NTHL1, PALB2, PMS2, PTEN, RAD51C, RAD51D, SMAD4, STK11, TP53, TSC1, TSC2, and VHL (sequencing and deletion/duplication); AXIN2, HOXB13, MBD4, MSH3, POLD1 and POLE (sequencing only); EPCAM and GREM1 (deletion/duplication only).    03/09/2023 Surgery   Right lumpectomy: 1 cm DCIS grade 3 with necrosis, margins negative, ER 90%, PR 90%   03/09/2023 Cancer Staging   Staging form: Breast, AJCC 8th Edition - Pathologic stage from 03/09/2023: Stage 0 (pTis (DCIS), pN0, cM0, ER+, PR+) - Signed by Percival Brace, NP on 08/31/2023 Stage prefix: Initial diagnosis   04/20/2023 - 06/04/2023 Radiation Therapy   Plan Name: Breast_R Site: Breast, Right Technique: 3D Mode: Photon Dose Per Fraction: 1.8 Gy Prescribed Dose (Delivered / Prescribed): 50.4 Gy / 50.4 Gy Prescribed Fxs (Delivered / Prescribed): 28 / 28    Plan Name: Breast_R_Bst Site: Breast, Right Technique: 3D Mode: Photon Dose Per Fraction: 2 Gy Prescribed Dose (Delivered / Prescribed): 10 Gy / 10 Gy Prescribed Fxs (Delivered / Prescribed): 5 / 5   06/2023 -  Anti-estrogen oral therapy   5 mg Tamoxifen  x 5 years     INTERVAL HISTORY:  Miranda Davis to review her survivorship care plan detailing her treatment course for breast cancer, as well as monitoring long-term side effects of that treatment, education regarding health maintenance, screening, and overall wellness and health promotion.     Overall, Miranda Davis reports feeling quite well.  She is taking tamoxifen  daily. She has had several UTis since prior to her breast cancer diagnosis. She was treated with macrobid which caused a rash, and caused her radiated area on her right chest wall to turn bright red.  That has since resolved and is improved.  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  She denies any significant issues with tamoxifen  and tolerates it well.    REVIEW OF SYSTEMS:  Review of Systems  Constitutional:  Negative for appetite change, chills, fatigue, fever and unexpected weight change.  HENT:   Negative for hearing loss, lump/mass and trouble swallowing.   Eyes:  Negative for eye problems and icterus.  Respiratory:  Negative for chest tightness, cough and shortness of breath.   Cardiovascular:  Negative for chest pain, leg swelling and palpitations.  Gastrointestinal:  Negative for abdominal distention, abdominal pain, constipation, diarrhea, nausea and vomiting.  Endocrine: Negative for hot flashes.  Genitourinary:  Negative for difficulty urinating.   Musculoskeletal:  Negative for arthralgias.  Skin:  Negative for  itching and rash.  Neurological:  Negative for dizziness, extremity weakness, headaches and numbness.  Hematological:  Negative for adenopathy. Does not bruise/bleed easily.  Psychiatric/Behavioral:  Negative  for depression. The patient is not nervous/anxious.   Breast: Denies any new nodularity, masses, tenderness, nipple changes, or nipple discharge.       PAST MEDICAL/SURGICAL HISTORY:  Past Medical History:  Diagnosis Date   Asthma    Breast cancer (HCC) 02/2023   Complication of anesthesia    one side take of epidural   Family history of breast cancer    Hx of varicella    Pyelonephritis 03/23/2009   not during pregnancy   Past Surgical History:  Procedure Laterality Date   BREAST BIOPSY Right 02/09/2023   3 clips   BREAST BIOPSY Right 02/09/2023   US  RT BREAST BX W LOC DEV 1ST LESION IMG BX SPEC US  GUIDE 02/09/2023 [facility]   BREAST BIOPSY Right 02/09/2023   US  RT BREAST BX W LOC DEV EA ADD LESION IMG BX SPEC US  GUIDE 02/09/2023 [facility]   BREAST BIOPSY Right 02/09/2023   US  RT BREAST BX W LOC DEV EA ADD LESION IMG BX SPEC US  GUIDE 02/09/2023 [facility]   BREAST BIOPSY Right 03/03/2023   US  RT BREAST BX W LOC DEV 1ST LESION IMG BX SPEC US  GUIDE 03/03/2023 [facility]   BREAST BIOPSY  03/04/2023   MM RT RADIOACTIVE SEED LOC MAMMO GUIDE 03/04/2023 [facility]   BREAST LUMPECTOMY WITH RADIOACTIVE SEED LOCALIZATION Right 03/09/2023   Procedure: RIGHT BREAST RADIOACTIVE SEED LOCALIZED LUMPECTOMY;  Surgeon: Caralyn Chandler, MD;  Location: [facility] OR;  Service: General;  Laterality: Right;   EYE SURGERY  03/23/2006   NO PAST SURGERIES       ALLERGIES:  Allergies  Allergen Reactions   Bee Venom Anaphylaxis   Wasp Venom Anaphylaxis   Macrobid [Nitrofurantoin Macrocrystal] Rash     CURRENT MEDICATIONS:  Outpatient Encounter Medications as of 08/31/2023  Medication Sig   tamoxifen  (NOLVADEX ) 10 MG tablet Take 0.5 tablets (5 mg total) by mouth daily.   [DISCONTINUED] cephALEXin  (KEFLEX ) 500 MG capsule Take 1 capsule (500 mg total) by mouth 2 (two) times daily. (Patient not taking: Reported on 08/31/2023)   No facility-administered encounter  medications on file as of 08/31/2023.     ONCOLOGIC FAMILY HISTORY:  Family History  Problem Relation Age of Onset   Healthy Mother    Healthy Father    Healthy Sister    Breast cancer Paternal Grandmother 2   Healthy Daughter    Healthy Son    Healthy Son    Healthy Half-Sister    Non-Hodgkin's lymphoma Maternal Great-grandmother    Stomach cancer Maternal Great-grandmother    Colon cancer Maternal Great-grandmother      SOCIAL HISTORY:  Social History   Socioeconomic History   Marital status: Married    Spouse name: Not on file   Number of children: Not on file   Years of education: Not on file   Highest education level: Not on file  Occupational History   Not on file  Tobacco Use   Smoking status: Never    Passive exposure: Never   Smokeless tobacco: Never  Vaping Use   Vaping status: Never Used  Substance and Sexual Activity   Alcohol use: Yes    Alcohol/week: 3.0 standard drinks of alcohol    Types: 3 Glasses of wine per week    Comment: socially   Drug use: No  Sexual activity: Yes  Other Topics Concern   Not on file  Social History Narrative   Not on file   Social Drivers of Health   Financial Resource Strain: Not on file  Food Insecurity: No Food Insecurity (04/05/2023)   Hunger Vital Sign    Worried About Running Out of Food in the Last Year: Never true    Ran Out of Food in the Last Year: Never true  Transportation Needs: No Transportation Needs (04/05/2023)   PRAPARE - Administrator, Civil Service (Medical): No    Lack of Transportation (Non-Medical): No  Physical Activity: Not on file  Stress: Not on file  Social Connections: Not on file  Intimate Partner Violence: Not At Risk (04/05/2023)   Humiliation, Afraid, Rape, and Kick questionnaire    Fear of Current or Ex-Partner: No    Emotionally Abused: No    Physically Abused: No    Sexually Abused: No     OBSERVATIONS/OBJECTIVE:  BP 123/68 (BP Location: Left Arm, Patient  Position: Sitting)   Pulse 65   Temp 98.2 F (36.8 C) (Temporal)   Resp 17   Ht 5\' 8"  (1.727 m)   Wt 164 lb 11.2 oz (74.7 kg)   SpO2 100%   BMI 25.04 kg/m  GENERAL: Patient is a well appearing female in no acute distress HEENT:  Sclerae anicteric.  Oropharynx clear and moist. No ulcerations or evidence of oropharyngeal candidiasis. Neck is supple.  NODES:  No cervical, supraclavicular, or axillary lymphadenopathy palpated.  BREAST EXAM:  right breast s/p lumpectomy and radiation, no sign of local recurrence, left breast benign LUNGS:  Clear to auscultation bilaterally.  No wheezes or rhonchi. HEART:  Regular rate and rhythm. No murmur appreciated. ABDOMEN:  Soft, nontender.  Positive, normoactive bowel sounds. No organomegaly palpated. MSK:  No focal spinal tenderness to palpation. Full range of motion bilaterally in the upper extremities. EXTREMITIES:  No peripheral edema.   SKIN:  Clear with no obvious rashes or skin changes. No nail dyscrasia. NEURO:  Nonfocal. Well oriented.  Appropriate affect.   LABORATORY DATA:  None for this visit.  DIAGNOSTIC IMAGING:  None for this visit.      ASSESSMENT AND PLAN:  Ms.. Davis is a pleasant 43 y.o. female with Stage 0 right breast DCIS, ER+/PR+, diagnosed in 02/2023, treated with lumpectomy, adjuvant radiation therapy, and anti-estrogen therapy with Tamoxifen  beginning in 06/2023.  She presents to the Survivorship Clinic for our initial meeting and routine follow-up post-completion of treatment for breast cancer.    1. Stage 0 right breast cancer:  Miranda Davis is continuing to recover from definitive treatment for breast cancer. She will follow-up with her medical oncologist, Dr.  Lee Public in 6 months with history and physical exam per surveillance protocol.  She will continue her anti-estrogen therapy with Tamoxifen . Thus far, she is tolerating the Tamoxifen  well, with minimal side effects. Her mammogram is due 01/2024; orders placed  today.  She is also recommended to consider breast MRI for surveillance due to her age of less than 50 at breast cancer diagnosis.  Orders placed for 07/2024.   Today, a comprehensive survivorship care plan and treatment summary was reviewed with the patient today detailing her breast cancer diagnosis, treatment course, potential late/long-term effects of treatment, appropriate follow-up care with recommendations for the future, and patient education resources.  A copy of this summary, along with a letter will be sent to the patient's primary care provider via mail/fax/In Basket message after  today's visit.    2. Recurrent UTIs: Began prior to starting tamoxifen .  I recommended she drink 60-70 ounces of water daily and increase the frequency she urinates daily.  I also suggested she use an intimate cleanser that is ph balanced and urinate after sexual intercourse.  If she continues to experience recurrent UTIs would recommend evaluation with Dr. Perley [facility] at [facility] u[facility].   3. Bone health:    She was given education on specific activities to promote bone health.  4. Cancer screening:  Due to Miranda Davis's history and her age, she should receive screening for skin cancers, colon cancer (at age 50), and gynecologic cancers.  The information and recommendations are listed on the patient's comprehensive care plan/treatment summary and were reviewed in detail with the patient.    5. Health maintenance and wellness promotion: Miranda Davis was encouraged to consume 5-7 servings of fruits and vegetables per day. We reviewed the "Nutrition Rainbow" handout.  She was also encouraged to engage in moderate to vigorous exercise for 30 minutes per day most days of the week.  She was instructed to limit her alcohol consumption and continue to abstain from tobacco use.     6. Support services/counseling: It is not uncommon for this period of the patient's cancer care trajectory to be one of many emotions and  stressors. She was given information regarding our available services and encouraged to contact me with any questions or for help enrolling in any of our support group/programs.    Follow up instructions:    -Return to cancer center in 6 months for f/u with Dr. Gudena.   -Mammogram due in 01/2024 -Breast MRI 07/2024 -She is welcome to return back to the Survivorship Clinic at any time; no additional follow-up needed at this time.  -Consider referral back to survivorship as a long-term survivor for continued surveillance  The patient was provided an opportunity to ask questions and all were answered. The patient agreed with the plan and demonstrated an understanding of the instructions.   Total encounter time:40 minutes*in face-to-face visit time, chart review, lab review, care coordination, order entry, and documentation of the encounter time.    Alwin Baars, NP 08/31/23 12:43 PM Medical Oncology and Hematology [facility] [address] [place], [place] [address] Tel. [phone]    Fax. [phone]  *Total Encounter Time as defined by the Centers for Medicare and Medicaid Services includes, in addition to the face-to-face time of a patient visit (documented in the note above) non-face-to-face time: obtaining and reviewing outside history, ordering and reviewing medications, tests or procedures, care coordination (communications with other health care professionals or caregivers) and documentation in the medical record.

## 2023-12-30 ENCOUNTER — Ambulatory Visit (INDEPENDENT_AMBULATORY_CARE_PROVIDER_SITE_OTHER)

## 2023-12-30 ENCOUNTER — Encounter: Payer: BC Managed Care – PPO | Admitting: Family Medicine

## 2023-12-30 VITALS — BP 111/79 | HR 73 | Temp 97.4°F | Ht 68.0 in | Wt 167.0 lb

## 2023-12-30 DIAGNOSIS — F411 Generalized anxiety disorder: Secondary | ICD-10-CM | POA: Diagnosis not present

## 2023-12-30 DIAGNOSIS — Z1321 Encounter for screening for nutritional disorder: Secondary | ICD-10-CM

## 2023-12-30 DIAGNOSIS — R519 Headache, unspecified: Secondary | ICD-10-CM | POA: Insufficient documentation

## 2023-12-30 DIAGNOSIS — Z1329 Encounter for screening for other suspected endocrine disorder: Secondary | ICD-10-CM | POA: Diagnosis not present

## 2023-12-30 DIAGNOSIS — Z0000 Encounter for general adult medical examination without abnormal findings: Secondary | ICD-10-CM | POA: Diagnosis not present

## 2023-12-30 DIAGNOSIS — E7800 Pure hypercholesterolemia, unspecified: Secondary | ICD-10-CM | POA: Insufficient documentation

## 2023-12-30 DIAGNOSIS — Z130 Encounter for screening for diseases of the blood and blood-forming organs and certain disorders involving the immune mechanism: Secondary | ICD-10-CM

## 2023-12-30 DIAGNOSIS — Z13228 Encounter for screening for other metabolic disorders: Secondary | ICD-10-CM
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
## 2023-12-30 NOTE — Assessment & Plan Note (Signed)
 Post-traumatic stress symptoms with anxiety, mood swings, irritability, and functional difficulties. - Refer to [facility] therapists in [place] for in-person therapy. - Provide resources for virtual therapy options. - Discussed potential future use of Zoloft if symptoms persist after amitriptyline adjustment
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
## 2023-12-30 NOTE — Assessment & Plan Note (Signed)
 Routine adult wellness visit with pending labs and health maintenance discussion. - Order blood work including kidney, liver, A1c, thyroid, vitamin D , and blood counts. - Send MyChart message with lab results. - Obtain records of recent Pap smear from Dr. Okey at [facility]. - Document recent flu vaccine. - Discuss pneumonia vaccine - Discuss COVID vaccine, offer prescription if desired.

## 2023-12-30 NOTE — Assessment & Plan Note (Signed)
 Last lipid panel: LDL 130, HDL 56, Trig 54. The 10-year ASCVD risk score (Arnett DK, et al., 2019) is: 0.4% Continue with low fat diet and exercise. Will cont to monitor.
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
## 2023-12-31 ENCOUNTER — Encounter: Payer: Self-pay | Admitting: Obstetrics and Gynecology

## 2023-12-31 ENCOUNTER — Ambulatory Visit: Payer: Self-pay

## 2023-12-31 LAB — CBC WITH DIFFERENTIAL/PLATELET
Basophils Absolute: 0.1 x10E3/uL (ref 0.0–0.2)
Basos: 1 %
EOS (ABSOLUTE): 0.2 x10E3/uL (ref 0.0–0.4)
Eos: 5 %
Hematocrit: 41.1 % (ref 34.0–46.6)
Hemoglobin: 13.6 g/dL (ref 11.1–15.9)
Immature Grans (Abs): 0 x10E3/uL (ref 0.0–0.1)
Immature Granulocytes: 0 %
Lymphocytes Absolute: 1.3 x10E3/uL (ref 0.7–3.1)
Lymphs: 25 %
MCH: 31.9 pg (ref 26.6–33.0)
MCHC: 33.1 g/dL (ref 31.5–35.7)
MCV: 97 fL (ref 79–97)
Monocytes Absolute: 0.7 x10E3/uL (ref 0.1–0.9)
Monocytes: 14 %
Neutrophils Absolute: 2.8 x10E3/uL (ref 1.4–7.0)
Neutrophils: 55 %
Platelets: 214 x10E3/uL (ref 150–450)
RBC: 4.26 x10E6/uL (ref 3.77–5.28)
RDW: 12.1 % (ref 11.7–15.4)
WBC: 5.1 x10E3/uL (ref 3.4–10.8)

## 2023-12-31 LAB — VITAMIN D 25 HYDROXY (VIT D DEFICIENCY, FRACTURES): Vit D, 25-Hydroxy: 27.4 ng/mL — ABNORMAL LOW (ref 30.0–100.0)

## 2023-12-31 LAB — LIPID PANEL
Chol/HDL Ratio: 2.7 ratio (ref 0.0–4.4)
Cholesterol, Total: 177 mg/dL (ref 100–199)
HDL: 66 mg/dL (ref >39)
LDL Chol Calc (NIH): 100 mg/dL — ABNORMAL HIGH (ref 0–99)
Triglycerides: 56 mg/dL (ref 0–149)
VLDL Cholesterol Cal: 11 mg/dL (ref 5–40)

## 2023-12-31 LAB — HEMOGLOBIN A1C
Est. average glucose Bld gHb Est-mCnc: 88 mg/dL
Hgb A1c MFr Bld: 4.7 % — ABNORMAL LOW (ref 4.8–5.6)

## 2023-12-31 LAB — TSH: TSH: 2.44 u[IU]/mL (ref 0.450–4.500)

## 2023-12-31 LAB — COMPREHENSIVE METABOLIC PANEL WITH GFR
ALT: 24 IU/L (ref 0–32)
AST: 28 IU/L (ref 0–40)
Albumin: 4.2 g/dL (ref 3.9–4.9)
Alkaline Phosphatase: 76 IU/L (ref 41–116)
BUN/Creatinine Ratio: 12 (ref 9–23)
BUN: 10 mg/dL (ref 6–24)
Bilirubin Total: 0.6 mg/dL (ref 0.0–1.2)
CO2: 19 mmol/L — ABNORMAL LOW (ref 20–29)
Calcium: 8.6 mg/dL — ABNORMAL LOW (ref 8.7–10.2)
Chloride: 106 mmol/L (ref 96–106)
Creatinine, Ser: 0.84 mg/dL (ref 0.57–1.00)
Globulin, Total: 2.7 g/dL (ref 1.5–4.5)
Glucose: 79 mg/dL (ref 70–99)
Potassium: 4.3 mmol/L (ref 3.5–5.2)
Sodium: 139 mmol/L (ref 134–144)
Total Protein: 6.9 g/dL (ref 6.0–8.5)
eGFR: 90 mL/min/1.73 (ref >59)

## 2024-01-14 DIAGNOSIS — E041 Nontoxic single thyroid nodule: Secondary | ICD-10-CM

## 2024-01-17 ENCOUNTER — Ambulatory Visit
Admission: RE | Admit: 2024-01-17 | Discharge: 2024-01-17 | Disposition: A | Source: Ambulatory Visit | Attending: Adult Health | Admitting: Adult Health

## 2024-01-17 DIAGNOSIS — D0511 Intraductal carcinoma in situ of right breast: Secondary | ICD-10-CM

## 2024-01-18 ENCOUNTER — Ambulatory Visit: Payer: Self-pay

## 2024-01-18 ENCOUNTER — Ambulatory Visit
Admission: RE | Admit: 2024-01-18 | Discharge: 2024-01-18 | Disposition: A | Discharge status: Home / Self Care | Source: Ambulatory Visit

## 2024-01-18 DIAGNOSIS — E041 Nontoxic single thyroid nodule: Secondary | ICD-10-CM

## 2024-01-25 MED ORDER — VALACYCLOVIR HCL 1 G PO TABS: 2000.0000 mg | ORAL_TABLET | Freq: Two times a day (BID) | ORAL | 0 refills | Status: DC

## 2024-02-02 ENCOUNTER — Encounter (HOSPITAL_COMMUNITY): Payer: Self-pay | Admitting: Obstetrics and Gynecology

## 2024-02-02 NOTE — Progress Notes (Signed)
 Spoke w/ via phone for pre-op interview--- [facility] needs dos---- UPT per anesthesia. Surgeon orders requested 02/02/24.        Lab results------ COVID test -----patient states asymptomatic no test needed Arrive at -------1115 NPO after MN NO Solid Food.  Clear liquids from MN until---1015 Pre-Surgery Ensure or G2:  Med rec completed Medications to take morning of surgery -----NONE Diabetic medication -----  GLP1 agonist last dose: GLP1 instructions:  Patient instructed no nail polish to be worn day of surgery Patient instructed to bring photo id and insurance card day of surgery Patient aware to have Driver (ride ) / caregiver    for 24 hours after surgery - Husband [name] Bolus Patient Special Instructions ----- Pre-Op special Instructions -----  Patient verbalized understanding of instructions that were given at this phone interview. Patient denies chest pain, sob, fever, cough at the interview.

## 2024-02-07 ENCOUNTER — Other Ambulatory Visit: Payer: Self-pay | Admitting: Obstetrics and Gynecology

## 2024-02-07 DIAGNOSIS — Z302 Encounter for sterilization: Secondary | ICD-10-CM

## 2024-02-08 ENCOUNTER — Encounter (HOSPITAL_COMMUNITY): Payer: Self-pay | Admitting: Obstetrics and Gynecology

## 2024-02-08 ENCOUNTER — Encounter (HOSPITAL_COMMUNITY)
Admission: RE | Disposition: A | Discharge status: Home / Self Care | Payer: Self-pay | Source: Home / Self Care | Attending: Obstetrics and Gynecology

## 2024-02-08 ENCOUNTER — Ambulatory Visit (HOSPITAL_BASED_OUTPATIENT_CLINIC_OR_DEPARTMENT_OTHER): Payer: Self-pay | Admitting: Certified Registered Nurse Anesthetist

## 2024-02-08 ENCOUNTER — Ambulatory Visit (HOSPITAL_COMMUNITY)
Admission: RE | Admit: 2024-02-08 | Discharge: 2024-02-08 | Disposition: A | Discharge status: Home / Self Care | Attending: Obstetrics and Gynecology | Admitting: Obstetrics and Gynecology

## 2024-02-08 ENCOUNTER — Ambulatory Visit (HOSPITAL_COMMUNITY): Payer: Self-pay | Admitting: Certified Registered Nurse Anesthetist

## 2024-02-08 DIAGNOSIS — Z01818 Encounter for other preprocedural examination: Secondary | ICD-10-CM

## 2024-02-08 DIAGNOSIS — J45909 Unspecified asthma, uncomplicated: Secondary | ICD-10-CM | POA: Insufficient documentation

## 2024-02-08 DIAGNOSIS — Z302 Encounter for sterilization: Secondary | ICD-10-CM

## 2024-02-08 HISTORY — PX: LAPAROSCOPIC BILATERAL SALPINGECTOMY: SHX5889

## 2024-02-08 HISTORY — DX: Anxiety disorder, unspecified: F41.9

## 2024-02-08 LAB — CBC
HCT: 39.1 % (ref 36.0–46.0)
Hemoglobin: 14 g/dL (ref 12.0–15.0)
MCH: 32.6 pg (ref 26.0–34.0)
MCHC: 35.8 g/dL (ref 30.0–36.0)
MCV: 90.9 fL (ref 80.0–100.0)
Platelets: 249 K/uL (ref 150–400)
RBC: 4.3 MIL/uL (ref 3.87–5.11)
RDW: 11.9 % (ref 11.5–15.5)
WBC: 5.7 K/uL (ref 4.0–10.5)
nRBC: 0 % (ref 0.0–0.2)

## 2024-02-08 LAB — POCT PREGNANCY, URINE: Preg Test, Ur: NEGATIVE

## 2024-02-08 SURGERY — SALPINGECTOMY, BILATERAL, LAPAROSCOPIC
Anesthesia: General | Site: Pelvis | Laterality: Bilateral

## 2024-02-08 MED ORDER — FENTANYL CITRATE (PF) 100 MCG/2ML IJ SOLN
25.0000 ug | INTRAMUSCULAR | Status: DC | PRN
  Administered 2024-02-08: 25 ug via INTRAVENOUS

## 2024-02-08 MED ORDER — ONDANSETRON HCL 4 MG/2ML IJ SOLN
INTRAMUSCULAR | Status: AC
  Filled 2024-02-08: qty 2

## 2024-02-08 MED ORDER — CHLORHEXIDINE GLUCONATE 0.12 % MT SOLN
15.0000 mL | Freq: Once | OROMUCOSAL | Status: AC
  Administered 2024-02-08: 15 mL via OROMUCOSAL

## 2024-02-08 MED ORDER — PROPOFOL 10 MG/ML IV BOLUS
INTRAVENOUS | Status: AC
Start: 2024-02-08 — End: 2024-02-08
  Filled 2024-02-08: qty 20

## 2024-02-08 MED ORDER — FENTANYL CITRATE (PF) 100 MCG/2ML IJ SOLN
INTRAMUSCULAR | Status: AC
  Filled 2024-02-08: qty 2

## 2024-02-08 MED ORDER — ROCURONIUM BROMIDE 10 MG/ML (PF) SYRINGE
PREFILLED_SYRINGE | INTRAVENOUS | Status: AC
  Filled 2024-02-08: qty 10

## 2024-02-08 MED ORDER — POVIDONE-IODINE 10 % EX SWAB: 2.0000 | Freq: Once | CUTANEOUS | Status: DC

## 2024-02-08 MED ORDER — 0.9 % SODIUM CHLORIDE (POUR BTL) OPTIME
TOPICAL | Status: DC | PRN
  Administered 2024-02-08: 1000 mL

## 2024-02-08 MED ORDER — OXYCODONE HCL 5 MG PO TABS: 5.0000 mg | ORAL_TABLET | Freq: Once | ORAL | Status: DC | PRN

## 2024-02-08 MED ORDER — CHLORHEXIDINE GLUCONATE 0.12 % MT SOLN
OROMUCOSAL | Status: AC
  Filled 2024-02-08: qty 15

## 2024-02-08 MED ORDER — PROPOFOL 10 MG/ML IV BOLUS
INTRAVENOUS | Status: DC | PRN
Start: 2024-02-08 — End: 2024-02-08
  Administered 2024-02-08: 150 mg via INTRAVENOUS

## 2024-02-08 MED ORDER — ACETAMINOPHEN 500 MG PO TABS
1000.0000 mg | ORAL_TABLET | Freq: Once | ORAL | Status: AC
  Administered 2024-02-08: 1000 mg via ORAL

## 2024-02-08 MED ORDER — LACTATED RINGERS IV SOLN: INTRAVENOUS | Status: DC

## 2024-02-08 MED ORDER — SCOPOLAMINE 1 MG/3DAYS TD PT72
MEDICATED_PATCH | TRANSDERMAL | Status: AC
  Filled 2024-02-08: qty 1

## 2024-02-08 MED ORDER — ONDANSETRON HCL 4 MG/2ML IJ SOLN
INTRAMUSCULAR | Status: DC | PRN
Start: 2024-02-08 — End: 2024-02-08
  Administered 2024-02-08: 4 mg via INTRAVENOUS

## 2024-02-08 MED ORDER — IBUPROFEN 800 MG PO TABS: 800.0000 mg | ORAL_TABLET | Freq: Three times a day (TID) | ORAL | 0 refills | Status: AC | PRN

## 2024-02-08 MED ORDER — ACETAMINOPHEN 500 MG PO TABS
ORAL_TABLET | ORAL | Status: AC
  Filled 2024-02-08: qty 2

## 2024-02-08 MED ORDER — BUPIVACAINE HCL (PF) 0.25 % IJ SOLN
INTRAMUSCULAR | Status: DC | PRN
  Administered 2024-02-08: 11 mL

## 2024-02-08 MED ORDER — OXYCODONE HCL 5 MG PO TABS: 5.0000 mg | ORAL_TABLET | Freq: Four times a day (QID) | ORAL | 0 refills | Status: DC | PRN

## 2024-02-08 MED ORDER — FENTANYL CITRATE (PF) 250 MCG/5ML IJ SOLN
INTRAMUSCULAR | Status: DC | PRN
  Administered 2024-02-08: 100 ug via INTRAVENOUS

## 2024-02-08 MED ORDER — FENTANYL CITRATE (PF) 250 MCG/5ML IJ SOLN
INTRAMUSCULAR | Status: AC
  Filled 2024-02-08: qty 5

## 2024-02-08 MED ORDER — MIDAZOLAM HCL 2 MG/2ML IJ SOLN
INTRAMUSCULAR | Status: AC
  Filled 2024-02-08: qty 2

## 2024-02-08 MED ORDER — DROPERIDOL 2.5 MG/ML IJ SOLN: 0.6250 mg | Freq: Once | INTRAMUSCULAR | Status: DC | PRN

## 2024-02-08 MED ORDER — ACETAMINOPHEN 10 MG/ML IV SOLN: 1000.0000 mg | Freq: Once | INTRAVENOUS | Status: DC | PRN

## 2024-02-08 MED ORDER — KETOROLAC TROMETHAMINE 30 MG/ML IJ SOLN
INTRAMUSCULAR | Status: DC | PRN
  Administered 2024-02-08: 30 mg via INTRAVENOUS

## 2024-02-08 MED ORDER — ORAL CARE MOUTH RINSE: 15.0000 mL | Freq: Once | OROMUCOSAL | Status: AC

## 2024-02-08 MED ORDER — SCOPOLAMINE 1 MG/3DAYS TD PT72
1.0000 | MEDICATED_PATCH | TRANSDERMAL | Status: DC
  Administered 2024-02-08: 1 mg via TRANSDERMAL

## 2024-02-08 MED ORDER — ROCURONIUM BROMIDE 10 MG/ML (PF) SYRINGE
PREFILLED_SYRINGE | INTRAVENOUS | Status: DC | PRN
  Administered 2024-02-08: 50 mg via INTRAVENOUS

## 2024-02-08 MED ORDER — LIDOCAINE 2% (20 MG/ML) 5 ML SYRINGE
INTRAMUSCULAR | Status: DC | PRN
  Administered 2024-02-08: 80 mg via INTRAVENOUS

## 2024-02-08 MED ORDER — LIDOCAINE 2% (20 MG/ML) 5 ML SYRINGE
INTRAMUSCULAR | Status: AC
  Filled 2024-02-08: qty 5

## 2024-02-08 MED ORDER — DEXAMETHASONE SOD PHOSPHATE PF 10 MG/ML IJ SOLN
INTRAMUSCULAR | Status: DC | PRN
  Administered 2024-02-08: 10 mg via INTRAVENOUS

## 2024-02-08 MED ORDER — OXYCODONE HCL 5 MG/5ML PO SOLN: 5.0000 mg | Freq: Once | ORAL | Status: DC | PRN

## 2024-02-08 MED ORDER — SUGAMMADEX SODIUM 200 MG/2ML IV SOLN
INTRAVENOUS | Status: DC | PRN
  Administered 2024-02-08: 300 mg via INTRAVENOUS

## 2024-02-08 MED ORDER — BUPIVACAINE HCL (PF) 0.25 % IJ SOLN
INTRAMUSCULAR | Status: AC
  Filled 2024-02-08: qty 30

## 2024-02-08 MED ORDER — MIDAZOLAM HCL (PF) 2 MG/2ML IJ SOLN
INTRAMUSCULAR | Status: DC | PRN
  Administered 2024-02-08: 2 mg via INTRAVENOUS

## 2024-02-08 SURGICAL SUPPLY — 28 items
CANISTER SUCTION 3000ML PPV (SUCTIONS) ×1 IMPLANT
DERMABOND ADVANCED .7 DNX12 (GAUZE/BANDAGES/DRESSINGS) IMPLANT
DRAPE SURG IRRIG POUCH 19X23 (DRAPES) ×1 IMPLANT
DRSG OPSITE POSTOP 3X4 (GAUZE/BANDAGES/DRESSINGS) IMPLANT
DURAPREP 26ML APPLICATOR (WOUND CARE) ×1 IMPLANT
GLOVE BIO SURGEON STRL SZ7 (GLOVE) ×1 IMPLANT
GLOVE BIOGEL PI IND STRL 7.0 (GLOVE) ×1 IMPLANT
KIT PINK PAD W/HEAD ARM REST (MISCELLANEOUS) ×1 IMPLANT
KIT TURNOVER KIT B (KITS) ×1 IMPLANT
LIGASURE VESSEL 5MM BLUNT TIP (ELECTROSURGICAL) IMPLANT
NDL HYPO 22X1.5 SAFETY MO (MISCELLANEOUS) IMPLANT
NEEDLE HYPO 22X1.5 SAFETY MO (MISCELLANEOUS) IMPLANT
PACK LAPAROSCOPY BASIN (CUSTOM PROCEDURE TRAY) ×1 IMPLANT
POUCH LAPAROSCOPIC INSTRUMENT (MISCELLANEOUS) IMPLANT
POWDER SURGICEL 3.0 GRAM (HEMOSTASIS) IMPLANT
SET TUBE SMOKE EVAC HIGH FLOW (TUBING) ×1 IMPLANT
SLEEVE Z-THREAD 5X100MM (TROCAR) ×1 IMPLANT
SOLN 0.9% NACL POUR BTL 1000ML (IV SOLUTION) ×1 IMPLANT
SPECIMEN JAR MEDIUM (MISCELLANEOUS) IMPLANT
SPIKE FLUID TRANSFER (MISCELLANEOUS) IMPLANT
SUT VIC AB 3-0 PS2 18XBRD (SUTURE) ×1 IMPLANT
SUT VICRYL 0 UR6 27IN ABS (SUTURE) ×2 IMPLANT
SYSTEM BAG RETRIEVAL 10MM (BASKET) IMPLANT
TIP ENDOSCOPIC SURGICEL (TIP) IMPLANT
TOWEL GREEN STERILE FF (TOWEL DISPOSABLE) ×2 IMPLANT
TROCAR BALLN 12MMX100 BLUNT (TROCAR) ×1 IMPLANT
TROCAR Z-THREAD BLADED 5X100MM (TROCAR) ×1 IMPLANT
WARMER LAPAROSCOPE (MISCELLANEOUS) ×1 IMPLANT

## 2024-02-08 NOTE — Op Note (Addendum)
 Pre-Operative Diagnosis: 1) desired permanent sterilization Postoperative Diagnosis:  1) desired permanent sterilization Procedure: Laparoscopic bilateral salpingectomy Surgeon: Dr. Marjorie Gull Assistant: Dr. Charmaine Oz Operative Findings: Normal-appearing bowels, ovaries, tubes, uterus Specimen: Bilateral fallopian tubes EBL: Minimal  Miranda Davis Is a 43 year old female who presents for definitive surgical management for desired permanent sterilization. Please see the patient's history and physical for complete details of the history. Management options were discussed with the patient. R/B/A reviewed. Following appropriate informed consent was taken to the operating room. The patient was appropriately identified during a time out procedure. General anesthesia was administered and the patient was placed in the dorsal lithotomy position in [employer]. The patient was prepped and draped in the normal sterile fashion. A speculum was placed into the vagina, a single-tooth tenaculum was placed on the anterior lip of the cervix, and a Hulka uterine manipulator was placed. The single tooth tenaculum and speculum were then removed. Attention was turned to the abdominal portion of the procedure. 7 cc of bupivicaine 0.25% was injected into the umbilical skin. A hemostat was used to grasp and elevate the base of the umbilical skin. The scalpel was used to make a vertical 10 mm incision in the umbilicus. The underlying subcutaneous tissue was bluntly dissected with the hemostat. The fascia was then grasped and elevated with hemostats x 2 and entered sharply. The fascia was anchored with 0 vicryl. The underlying peritoneum was bluntly entered with a hemostat and 2 S-retractors were placed. The hasson port was placed, port balloon inflated, and fascial anchoring sutures were attached to the port. The abdomen was insufflated after abdominal entry was confirmed with direct visualization. The abdomen was  inspected for the above findings. A right and left lower quadrant port was placed under direct visualization after the skin was infiltrated with 1cc of marcaine  and a 5 mm skin incision was made. The patient was placed in trendelenburg and a blunt probe was was used to sweep the bowels into the upper abdomen. The uterus was elevated by the surgical tech and the right fallopian tube was grasped and elevated. The Ligasure was used to serially transect the mesosalpinx underlying the fallopian tube down to the level of its insertion into the uterus. The fallopian tube was then transected and placed in the anterior cul-de-sac. The same procedure was repeated on the left side. The surgical sites were inspected and found to be hemostatic. The camera was then placed in the left lower quadrant port site and the fallopian tubes were removed through the umbilical port site. The camera was then replaced into the umbilical port and the right and left lower quadrant ports were removed under direct visualization. The abdomen was then desufflated and the hassan port was removed. The fascia was closed with the 0 vicryl. The skin was closed with 4-0 vicryl subcuticular and dermabond. The lower quadrant port sites were closed with single dermal interrupted stitches and dermabond. All sponge, instrument, and needle counts were correct. The patient tolerated the procedure well and was transferred to the PACU in stable condition.

## 2024-02-08 NOTE — Anesthesia Procedure Notes (Signed)
 Procedure Name: Intubation Date/Time: 02/08/2024 1:10 PM  Performed by: Lyle Delon LABOR, CRNAPre-anesthesia Checklist: Patient identified, Emergency Drugs available, Suction available and Patient being monitored Patient Re-evaluated:Patient Re-evaluated prior to induction Oxygen Delivery Method: Circle system utilized Preoxygenation: Pre-oxygenation with 100% oxygen Induction Type: IV induction Ventilation: Mask ventilation without difficulty Laryngoscope Size: Mac and 3 Grade View: Grade I Tube type: Oral Tube size: 7.0 mm Number of attempts: 1 Airway Equipment and Method: Stylet and Oral airway Placement Confirmation: ETT inserted through vocal cords under direct vision, positive ETCO2 and breath sounds checked- equal and bilateral Secured at: 22 cm Tube secured with: Tape Dental Injury: Teeth and Oropharynx as per pre-operative assessment

## 2024-02-08 NOTE — Anesthesia Postprocedure Evaluation (Signed)
 Anesthesia Post Note  Patient: Miranda Davis  Procedure(s) Performed: SALPINGECTOMY, BILATERAL, LAPAROSCOPIC (Bilateral: Pelvis)     Patient location during evaluation: PACU Anesthesia Type: General Level of consciousness: awake and alert Pain management: pain level controlled Vital Signs Assessment: post-procedure vital signs reviewed and stable Respiratory status: spontaneous breathing, nonlabored ventilation, respiratory function stable and patient connected to nasal cannula oxygen Cardiovascular status: blood pressure returned to baseline and stable Postop Assessment: no apparent nausea or vomiting Anesthetic complications: no   No notable events documented.  Last Vitals:  Vitals:   02/08/24 1129 02/08/24 1400  BP: (!) 130/94 117/78  Pulse: 84 69  Resp: 16 (!) 8  Temp: 36.6 C 36.6 C  SpO2: 100% 100%    Last Pain:  Vitals:   02/08/24 1418  TempSrc:   PainSc: 7                  Thom JONELLE Peoples

## 2024-02-08 NOTE — H&P (Signed)
 Miranda Davis is an 43 y.o. female.  43 year old female presents for laparoscopic bilateral salpingectomy for desired permanent sterilization.  Patient was recently diagnosed with breast cancer and discontinued hormonal contraception.  She did not wish to pursue a ParaGard intrauterine device.  Her spouse would not consider vasectomy therefore she wishes to undergo bilateral salpingectomy.  Risks/benefits/alternatives discussed with patient at length and she wishes to proceed   Past Medical History:  Diagnosis Date   Anxiety    Asthma    Breast cancer (HCC) 02/2023   Complication of anesthesia    one side take of epidural   Family history of breast cancer    Hx of varicella    Pyelonephritis 03/23/2009   not during pregnancy    Past Surgical History:  Procedure Laterality Date   BREAST BIOPSY Right 02/09/2023   3 clips   BREAST BIOPSY Right 02/09/2023   US  RT BREAST BX W LOC DEV 1ST LESION IMG BX SPEC US  GUIDE 02/09/2023 [facility]   BREAST BIOPSY Right 02/09/2023   US  RT BREAST BX W LOC DEV EA ADD LESION IMG BX SPEC US  GUIDE 02/09/2023 [facility]   BREAST BIOPSY Right 02/09/2023   US  RT BREAST BX W LOC DEV EA ADD LESION IMG BX SPEC US  GUIDE 02/09/2023 [facility]   BREAST BIOPSY Right 03/03/2023   US  RT BREAST BX W LOC DEV 1ST LESION IMG BX SPEC US  GUIDE 03/03/2023 [facility]   BREAST BIOPSY  03/04/2023   MM RT RADIOACTIVE SEED LOC MAMMO GUIDE 03/04/2023 [facility]   BREAST LUMPECTOMY WITH RADIOACTIVE SEED LOCALIZATION Right 03/09/2023   Procedure: RIGHT BREAST RADIOACTIVE SEED LOCALIZED LUMPECTOMY;  Surgeon: Curvin Deward MOULD, MD;  Location: [facility];  Service: General;  Laterality: Right;   EYE SURGERY  03/23/2006   NO PAST SURGERIES      Family History  Problem Relation Age of Onset   Healthy Mother    Healthy Father    Healthy Sister    Breast cancer Paternal Grandmother 28   Healthy Daughter    Healthy Son    Healthy Son     Healthy Half-Sister    Non-Hodgkin's lymphoma Maternal Great-grandmother    Stomach cancer Maternal Great-grandmother    Colon cancer Maternal Great-grandmother     Social History:  reports that she has never smoked. She has never been exposed to tobacco smoke. She has never used smokeless tobacco. She reports current alcohol use of about 3.0 standard drinks of alcohol per week. She reports that she does not use drugs.  Allergies:  Allergies  Allergen Reactions   Bee Venom Anaphylaxis   Wasp Venom Anaphylaxis   Macrobid [Nitrofurantoin Macrocrystal] Rash    Medications Prior to Admission  Medication Sig Dispense Refill Last Dose/Taking   amitriptyline (ELAVIL) 25 MG tablet Take 25 mg by mouth at bedtime.   02/07/2024   ibuprofen  (ADVIL ) 800 MG tablet Take 800 mg by mouth every 6 (six) hours as needed for headache.   02/06/2024   valACYclovir  (VALTREX ) 1000 MG tablet Take 2 tablets (2,000 mg total) by mouth 2 (two) times daily. 4 tablet 0 Past Week    Review of Systems  Blood pressure (!) 130/94, pulse 84, temperature 97.8 F (36.6 C), temperature source Oral, resp. rate 16, height 5' 8 (1.727 m), weight 74.8 kg, last menstrual period 01/30/2024, SpO2 100%. Physical Exam AOx3, NAD Abd soft Normal work of breathing  Results for orders placed or performed during the hospital encounter of 02/08/24 (  from the past 24 hours)  Pregnancy, urine POC     Status: None   Collection Time: 02/08/24 11:20 AM  Result Value Ref Range   Preg Test, Ur NEGATIVE NEGATIVE  CBC     Status: None   Collection Time: 02/08/24 11:44 AM  Result Value Ref Range   WBC 5.7 4.0 - 10.5 K/uL   RBC 4.30 3.87 - 5.11 MIL/uL   Hemoglobin 14.0 12.0 - 15.0 g/dL   HCT 60.8 63.9 - 53.9 %   MCV 90.9 80.0 - 100.0 fL   MCH 32.6 26.0 - 34.0 pg   MCHC 35.8 30.0 - 36.0 g/dL   RDW 88.0 88.4 - 84.4 %   Platelets 249 150 - 400 K/uL   nRBC 0.0 0.0 - 0.2 %    No results found.  Assessment/Plan: 1) Proceed with L/S  bilateral Salpingectomy. Informed consent obtained 2) SCDs for DVT prophylaxis  Marjorie Gull 02/08/2024, 12:21 PM

## 2024-02-08 NOTE — Anesthesia Preprocedure Evaluation (Addendum)
 Anesthesia Evaluation  Patient identified by MRN, date of birth, ID band Patient awake    Reviewed: Allergy & Precautions, H&P , NPO status , Patient's Chart, lab work & pertinent test results  Airway Mallampati: II  TM Distance: <3 FB Neck ROM: Full    Dental no notable dental hx.    Pulmonary asthma    Pulmonary exam normal breath sounds clear to auscultation       Cardiovascular (-) hypertension(-) Past MI Normal cardiovascular exam Rhythm:Regular Rate:Normal     Neuro/Psych  Headaches, neg Seizures PSYCHIATRIC DISORDERS Anxiety        GI/Hepatic negative GI ROS, Neg liver ROS,,,  Endo/Other  negative endocrine ROS    Renal/GU   negative genitourinary   Musculoskeletal negative musculoskeletal ROS (+)    Abdominal   Peds negative pediatric ROS (+)  Hematology negative hematology ROS (+)   Anesthesia Other Findings   Reproductive/Obstetrics negative OB ROS Right sided breast caner s/p lumpectomy                               Anesthesia Physical Anesthesia Plan  ASA: 2  Anesthesia Plan: General   Post-op Pain Management: Tylenol  PO (pre-op)*   Induction: Intravenous  PONV Risk Score and Plan: 3 and Ondansetron , Dexamethasone , Midazolam, Scopolamine patch - Pre-op and Treatment may vary due to age or medical condition  Airway Management Planned: Oral ETT  Additional Equipment: None  Intra-op Plan:   Post-operative Plan: Extubation in OR  Informed Consent: I have reviewed the patients History and Physical, chart, labs and discussed the procedure including the risks, benefits and alternatives for the proposed anesthesia with the patient or authorized representative who has indicated his/her understanding and acceptance.     Dental advisory given  Plan Discussed with: CRNA  Anesthesia Plan Comments:          Anesthesia Quick Evaluation

## 2024-02-08 NOTE — Discharge Instructions (Addendum)
  Post Anesthesia Home Care Instructions  Activity: Get plenty of rest for the remainder of the day. A responsible individual must stay with you for 24 hours following the procedure.  For the next 24 hours, DO NOT: -Drive a car -Advertising copywriter -Drink alcoholic beverages -Take any medication unless instructed by your physician -Make any legal decisions or sign important papers.  Meals: Start with liquid foods such as gelatin or soup. Progress to regular foods as tolerated. Avoid greasy, spicy, heavy foods. If nausea and/or vomiting occur, drink only clear liquids until the nausea and/or vomiting subsides. Call your physician if vomiting continues.  Special Instructions/Symptoms: Your throat may feel dry or sore from the anesthesia or the breathing tube placed in your throat during surgery. If this causes discomfort, gargle with warm salt water. The discomfort should disappear within 24 hours.  If you had a scopolamine patch placed behind your ear for the management of post- operative nausea and/or vomiting:  1. The medication in the patch is effective for 72 hours, after which it should be removed.  Wrap patch in a tissue and discard in the trash. Wash hands thoroughly with soap and water. 2. You may remove the patch earlier than 72 hours if you experience unpleasant side effects which may include dry mouth, dizziness or visual disturbances. 3. Avoid touching the patch. Wash your hands with soap and water after contact with the patch.  Do not take any Tylenol  before 5:35 pm.

## 2024-02-08 NOTE — Transfer of Care (Signed)
 Immediate Anesthesia Transfer of Care Note  Patient: Miranda Davis Bolus  Procedure(s) Performed: SALPINGECTOMY, BILATERAL, LAPAROSCOPIC (Bilateral: Pelvis)  Patient Location: PACU  Anesthesia Type:General  Level of Consciousness: awake and alert   Airway & Oxygen Therapy: Patient Spontanous Breathing and Patient connected to nasal cannula oxygen  Post-op Assessment: Report given to RN and Post -op Vital signs reviewed and stable  Post vital signs: Reviewed and stable  Last Vitals:  Vitals Value Taken Time  BP    Temp    Pulse    Resp    SpO2      Last Pain:  Vitals:   02/08/24 1129  TempSrc: Oral  PainSc: 0-No pain      Patients Stated Pain Goal: 5 (02/08/24 1129)  Complications: No notable events documented.

## 2024-02-09 ENCOUNTER — Encounter (HOSPITAL_COMMUNITY): Payer: Self-pay | Admitting: Obstetrics and Gynecology

## 2024-02-09 ENCOUNTER — Ambulatory Visit (INDEPENDENT_AMBULATORY_CARE_PROVIDER_SITE_OTHER): Admitting: Behavioral Health

## 2024-02-09 DIAGNOSIS — F4310 Post-traumatic stress disorder, unspecified: Secondary | ICD-10-CM | POA: Diagnosis not present

## 2024-02-09 LAB — SURGICAL PATHOLOGY
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
## 2024-02-09 NOTE — Progress Notes (Unsigned)
   French Ana, [facility]

## 2024-02-10 ENCOUNTER — Other Ambulatory Visit (HOSPITAL_COMMUNITY)
Admission: RE | Admit: 2024-02-10 | Discharge: 2024-02-10 | Disposition: A | Discharge status: Home / Self Care | Source: Ambulatory Visit

## 2024-02-10 ENCOUNTER — Ambulatory Visit: Payer: Self-pay

## 2024-02-10 ENCOUNTER — Ambulatory Visit: Admission: RE | Admit: 2024-02-10 | Discharge: 2024-02-10 | Disposition: A | Source: Ambulatory Visit

## 2024-02-10 DIAGNOSIS — E041 Nontoxic single thyroid nodule: Secondary | ICD-10-CM | POA: Insufficient documentation

## 2024-02-14 LAB — CYTOLOGY - NON PAP

## 2024-02-15 ENCOUNTER — Ambulatory Visit: Payer: Self-pay

## 2024-03-01 ENCOUNTER — Inpatient Hospital Stay: Attending: Hematology and Oncology | Admitting: Hematology and Oncology

## 2024-03-01 VITALS — BP 119/76 | HR 83 | Temp 98.2°F | Resp 19 | Wt 169.5 lb

## 2024-03-01 DIAGNOSIS — D0511 Intraductal carcinoma in situ of right breast: Secondary | ICD-10-CM | POA: Diagnosis not present

## 2024-03-01 DIAGNOSIS — Z79899 Other long term (current) drug therapy: Secondary | ICD-10-CM | POA: Insufficient documentation

## 2024-03-01 DIAGNOSIS — Z923 Personal history of irradiation: Secondary | ICD-10-CM | POA: Diagnosis not present

## 2024-03-01 DIAGNOSIS — Z79810 Long term (current) use of selective estrogen receptor modulators (SERMs): Secondary | ICD-10-CM | POA: Insufficient documentation

## 2024-03-01 NOTE — Progress Notes (Signed)
 Patient Care Team: Gayle Saddie JULIANNA DEVONNA as PCP - General (Physician Assistant) Okey Leader, MD as Consulting Physician (Obstetrics and Gynecology) Odean Potts, MD as Consulting Physician (Hematology and Oncology) Dewey Rush, MD as Consulting Physician (Radiation Oncology)  DIAGNOSIS:  Encounter Diagnosis  Name Primary?   Ductal carcinoma in situ (DCIS) of right breast Yes    SUMMARY OF ONCOLOGIC HISTORY: Oncology History  Ductal carcinoma in situ (DCIS) of right breast  02/22/2023 Initial Diagnosis   Ductal carcinoma in situ (DCIS) of right breast   03/05/2023 Cancer Staging   Staging form: Breast, AJCC 8th Edition - Clinical stage from 03/05/2023: Stage 0 (cTis (DCIS), cN0, cM0, G3, ER+, PR+, HER2: Not Assessed) - Signed by Odean Potts, MD on 03/05/2023 Stage prefix: Initial diagnosis Histologic grading system: 3 grade system   03/09/2023 Genetic Testing   Negative genetic testing on the BRCAPlus panel and CancerNext-RNAinsight.  The report date is 03/09/2023 and 03/11/2023.   The BRCAPlus gene panel offered by [facility] and includes sequencing and rearrangement analysis for the following 13 genes: ATM, BARD1, BRCA1, BRCA2, CDH1, CHEK2, NF1, PALB2, PTEN, RAD51C, RAD51D, STK11 and TP53.  The [facility] CancerNext+RNAinsight Panel includes sequencing, rearrangement analysis, and RNA analysis for the following 39 genes: APC, ATM, BAP1, BARD1, BMPR1A, BRCA1, BRCA2, BRIP1, CDH1, CDKN2A, CHEK2, FH, FLCN, MET, MLH1, MSH2, MSH6, MUTYH, NF1, NTHL1, PALB2, PMS2, PTEN, RAD51C, RAD51D, SMAD4, STK11, TP53, TSC1, TSC2, and VHL (sequencing and deletion/duplication); AXIN2, HOXB13, MBD4, MSH3, POLD1 and POLE (sequencing only); EPCAM and GREM1 (deletion/duplication only).    03/09/2023 Surgery   Right lumpectomy: 1 cm DCIS grade 3 with necrosis, margins negative, ER 90%, PR 90%   03/09/2023 Cancer Staging   Staging form: Breast, AJCC 8th Edition - Pathologic stage from 03/09/2023: Stage 0  (pTis (DCIS), pN0, cM0, ER+, PR+) - Signed by Crawford Morna Pickle, NP on 08/31/2023 Stage prefix: Initial diagnosis   04/20/2023 - 06/04/2023 Radiation Therapy   Plan Name: Breast_R Site: Breast, Right Technique: 3D Mode: Photon Dose Per Fraction: 1.8 Gy Prescribed Dose (Delivered / Prescribed): 50.4 Gy / 50.4 Gy Prescribed Fxs (Delivered / Prescribed): 28 / 28   Plan Name: Breast_R_Bst Site: Breast, Right Technique: 3D Mode: Photon Dose Per Fraction: 2 Gy Prescribed Dose (Delivered / Prescribed): 10 Gy / 10 Gy Prescribed Fxs (Delivered / Prescribed): 5 / 5   06/2023 -  Anti-estrogen oral therapy   5 mg Tamoxifen  x 5 years     CHIEF COMPLIANT: Surveillance of breast cancer  HISTORY OF PRESENT ILLNESS:  History of Present Illness Miranda Davis is a 43 year old female with ER/PR-positive, 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  She has no breast pain, palpable masses, nipple discharge, or other breast symptoms. Surveillance mammogram in October 2025 was unremarkable.  She stopped tamoxifen  at the end of June 2025 because of recurrent urinary tract infections and adverse reactions to antibiotics. Since stopping tamoxifen , she has had no further urinary tract infections or genitourinary symptoms. She increased hydration and other preventive measures for urinary tract infection prophylaxis as previously discussed.  May 24, 2023: Follow-up visit for DCIS after right lumpectomy and ongoing adjuvant radiation therapy; patient reported skin irritation from radiation, which was expected to conclude in two weeks. Tamoxifen  therapy discussed and planned to start on June 22, 2023. Patient consented to treatment plan and was scheduled  for a survivorship care plan visit in three months.      ALLERGIES:  is allergic to bee venom, wasp venom, and macrobid [nitrofurantoin macrocrystal].  MEDICATIONS:  Current Outpatient Medications  Medication Sig Dispense Refill   amitriptyline (ELAVIL) 25 MG tablet Take 25 mg by mouth at bedtime.     ibuprofen  (ADVIL ) 800 MG tablet Take 1 tablet (800 mg total) by mouth every 8 (eight) hours as needed. 30 tablet 0   No current facility-administered medications for this visit.    PHYSICAL EXAMINATION: ECOG PERFORMANCE STATUS: 1 - Symptomatic but completely ambulatory  Vitals:   03/01/24 1014  BP: 119/76  Pulse: 83  Resp: 19  Temp: 98.2 F (36.8 C)  SpO2: 100%   Filed Weights   03/01/24 1014  Weight: 169 lb 8 oz (76.9 kg)    LABORATORY DATA:  I have reviewed the data as listed    Latest Ref Rng & Units 12/30/2023    8:44 AM 01/07/2023    8:27 AM 06/12/2020    8:50 AM  CMP  Glucose 70 - 99 mg/dL 79  87  55   BUN 6 - 24 mg/dL 10  11  8    Creatinine 0.57 - 1.00 mg/dL 9.15  9.10  9.25   Sodium 134 - 144 mmol/L 139  142  138   Potassium 3.5 - 5.2 mmol/L 4.3  4.8  4.2   Chloride 96 - 106 mmol/L 106  106  103   CO2 20 - 29 mmol/L 19  22  22    Calcium 8.7 - 10.2 mg/dL 8.6  9.1  9.1   Total Protein 6.0 - 8.5 g/dL 6.9  7.4  7.0   Total Bilirubin 0.0 - 1.2 mg/dL 0.6  0.6  0.5   Alkaline Phos 41 - 116 IU/L 76  69  64   AST 0 - 40 IU/L 28  32  25   ALT 0 - 32 IU/L 24  37  16     Lab Results  Component Value Date   WBC 5.7 02/08/2024   HGB 14.0 02/08/2024   HCT 39.1 02/08/2024   MCV 90.9 02/08/2024   PLT 249 02/08/2024   NEUTROABS 2.8 12/30/2023    ASSESSMENT & PLAN:  Ductal carcinoma in situ (DCIS) of right breast 03/09/2023:Right lumpectomy: 1 cm DCIS grade 3 with necrosis, margins negative, ER 90%, PR 90% 04/20/2022-06/04/2022: Adjuvant radiation 06/22/23: started Tamoxifen  discontinued after 3 months  Breast Cancer Surveillance: Breast Exam: 03/01/24: Benign Mammograms 01/17/2024: Benign density Cat B Breast MRI  ordered for May 2026  RTC in 1 year     No orders of the defined types were placed in this encounter.  The patient has a good understanding of the overall plan. she agrees with it. she will call with any problems that may develop before the next visit here.  I personally spent a total of 30 minutes in the care of the patient today including preparing to see the patient, getting/reviewing separately obtained history, performing a medically appropriate exam/evaluation, counseling and educating, placing orders, referring and communicating with other health care professionals, documenting clinical information in the EHR, independently interpreting results, communicating results, and coordinating care.   Viinay K Orine Goga, MD 03/01/24

## 2024-03-01 NOTE — Assessment & Plan Note (Signed)
 03/09/2023:Right lumpectomy: 1 cm DCIS grade 3 with necrosis, margins negative, ER 90%, PR 90% 04/20/2022-06/04/2022: Adjuvant radiation 06/22/23: started Tamoxifen    Tamoxifen  Toxicities:   Breast Cancer Surveillance: Breast Exam: 03/01/24: Benign Mammograms 01/17/2024: Benign density Cat B Breast MRI ordered for May 2026  RTC in 1 year

## 2024-04-06 ENCOUNTER — Ambulatory Visit (INDEPENDENT_AMBULATORY_CARE_PROVIDER_SITE_OTHER): Admitting: Behavioral Health

## 2024-04-06 ENCOUNTER — Encounter: Payer: Self-pay | Admitting: Behavioral Health

## 2024-04-06 DIAGNOSIS — F4310 Post-traumatic stress disorder, unspecified: Secondary | ICD-10-CM | POA: Diagnosis not present

## 2024-04-06 DIAGNOSIS — F411 Generalized anxiety disorder: Secondary | ICD-10-CM | POA: Diagnosis not present

## 2024-04-06 NOTE — Progress Notes (Signed)
   French Ana, [facility]

## 2024-04-21 ENCOUNTER — Encounter: Payer: Self-pay | Admitting: Behavioral Health

## 2024-04-21 ENCOUNTER — Ambulatory Visit: Admitting: Behavioral Health

## 2024-04-21 DIAGNOSIS — F411 Generalized anxiety disorder: Secondary | ICD-10-CM

## 2024-04-21 DIAGNOSIS — F4310 Post-traumatic stress disorder, unspecified: Secondary | ICD-10-CM
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
## 2024-05-05 ENCOUNTER — Ambulatory Visit: Admitting: Behavioral Health

## 2024-07-21 ENCOUNTER — Other Ambulatory Visit

## 2024-12-25 ENCOUNTER — Other Ambulatory Visit

## 2025-01-01 ENCOUNTER — Encounter

## 2025-03-01 ENCOUNTER — Inpatient Hospital Stay: Admitting: Adult Health
